# Patient Record
Sex: Male | Born: 1985 | Race: White | Hispanic: No | Marital: Single | Smoking: Current every day smoker
Health system: Southern US, Community
[De-identification: ages and names within clinical notes are randomized; demographics above are authoritative.]

## PROBLEM LIST (undated history)

## (undated) DIAGNOSIS — F1990 Other psychoactive substance use, unspecified, uncomplicated: Secondary | ICD-10-CM

## (undated) DIAGNOSIS — F1910 Other psychoactive substance abuse, uncomplicated: Secondary | ICD-10-CM

## (undated) DIAGNOSIS — B1920 Unspecified viral hepatitis C without hepatic coma: Secondary | ICD-10-CM

---

## 2008-07-26 ENCOUNTER — Emergency Department (HOSPITAL_COMMUNITY): Admission: EM | Admit: 2008-07-26 | Discharge: 2008-07-26 | Payer: Self-pay | Admitting: Emergency Medicine

## 2008-07-30 ENCOUNTER — Emergency Department (HOSPITAL_COMMUNITY): Admission: EM | Admit: 2008-07-30 | Discharge: 2008-07-30 | Payer: Self-pay | Admitting: Emergency Medicine

## 2012-12-09 ENCOUNTER — Encounter (HOSPITAL_COMMUNITY): Payer: Self-pay | Admitting: *Deleted

## 2012-12-09 ENCOUNTER — Emergency Department (HOSPITAL_COMMUNITY)
Admission: EM | Admit: 2012-12-09 | Discharge: 2012-12-10 | Disposition: A | Discharge status: Home / Self Care | Payer: Self-pay | Attending: Emergency Medicine | Admitting: Emergency Medicine

## 2012-12-09 ENCOUNTER — Ambulatory Visit (HOSPITAL_COMMUNITY)
Admission: AD | Admit: 2012-12-09 | Discharge: 2012-12-09 | Disposition: A | Discharge status: Home / Self Care | Payer: Self-pay | Attending: Psychiatry | Admitting: Psychiatry

## 2012-12-09 DIAGNOSIS — F1120 Opioid dependence, uncomplicated: Secondary | ICD-10-CM | POA: Insufficient documentation

## 2012-12-09 DIAGNOSIS — F17200 Nicotine dependence, unspecified, uncomplicated: Secondary | ICD-10-CM | POA: Insufficient documentation

## 2012-12-09 DIAGNOSIS — Z8781 Personal history of (healed) traumatic fracture: Secondary | ICD-10-CM | POA: Insufficient documentation

## 2012-12-09 HISTORY — DX: Other psychoactive substance abuse, uncomplicated: F19.10

## 2012-12-09 LAB — COMPREHENSIVE METABOLIC PANEL
ALT: 35 U/L (ref 0–53)
AST: 24 U/L (ref 0–37)
Albumin: 4 g/dL (ref 3.5–5.2)
CO2: 25 mEq/L (ref 19–32)
Calcium: 9.6 mg/dL (ref 8.4–10.5)
Chloride: 97 mEq/L (ref 96–112)
GFR calc non Af Amer: >90 mL/min (ref >90)
Sodium: 135 mEq/L (ref 135–145)
Total Bilirubin: 0.6 mg/dL (ref 0.3–1.2)

## 2012-12-09 LAB — RAPID URINE DRUG SCREEN, HOSP PERFORMED
Amphetamines: NOT DETECTED
Benzodiazepines: NOT DETECTED
Opiates: POSITIVE — AB
Tetrahydrocannabinol: NOT DETECTED

## 2012-12-09 LAB — CBC
Platelets: 271 10*3/uL (ref 150–400)
RBC: 5.16 MIL/uL (ref 4.22–5.81)
RDW: 12.6 % (ref 11.5–15.5)
WBC: 12 10*3/uL — ABNORMAL HIGH (ref 4.0–10.5)

## 2012-12-09 NOTE — BH Assessment (Signed)
Assessment Note   Pedro Peterson is a 27 y.o. male presents to [facility] for detox/rehab from Heroin.  Pt denies SI/HI/Psych.  Pt reports using Heroin for 2 yrs; consumes 1 gram($120) daily, last use was 12/09/12. Pt injects in his right arm and has a possible abscess at the injection site.  Pt states that he shattered both his ankles in an accident 2 yrs ago and was given pain meds--roxicodone, morphine and dilaudid.  Pt says he was supposed to have surgery but unable to schedule surgery, stating that his opioid need progressed because he couldn't get anymore prescriptions from his doctor.  Pt says he buys the heroin in [facility].  Pt says he was [facility] 2 mos ago but didn't finish the program because his girlfriend was having a baby.  Pt says he lost his girlfriend, daughter, home and his job and needs to get sober.    Pt c/o w/d sxs: diarrhea, body aches, runny nose, "skin crawling", and "nervous energy".  Pt says he used $20 worth of Heroin today to help with w/d sxs.  Pt requested rehab with this Clinical research associate and was given referrals for rehab facilities.  Pt declined [facility] and proper protocol was following by [facility] staff.       Axis I: Opioid Dependence Axis II: Deferred Axis III:  Past Medical History  Diagnosis Date  . Drug abuse    Axis IV: economic problems, housing problems, occupational problems, other psychosocial or environmental problems, problems related to social environment and problems with primary support group Axis V: 51-60 moderate symptoms  Past Medical History: No past medical history on file.  No past surgical history on file.  Family History: No family history on file.  Social History:  reports that he has been smoking.  He does not have any smokeless tobacco history on file. He reports that he uses illicit drugs (Heroin). He reports that he does not drink alcohol.  Additional Social History:  Alcohol / Drug Use Pain Medications: None  Prescriptions: None  Over the Counter: None   History of alcohol / drug use?: Yes Longest period of sobriety (when/how long): 1 month ago  Negative Consequences of Use: Work / School;Personal relationships;Financial Withdrawal Symptoms: Cramps;Diarrhea;Nausea / Vomiting;Other (Comment) ("Nervous", "Skin Crawling") Substance #1 Name of Substance 1: Heroin  1 - Age of First Use: 24 YOM  1 - Amount (size/oz): 1 Gram--$120 1 - Frequency: Daily  1 - Duration: On-going  1 - Last Use / Amount: 12/09/12  CIWA:   COWS: Clinical Opiate Withdrawal Scale (COWS) Resting Pulse Rate: Pulse Rate 80 or below Sweating: No report of chills or flushing Restlessness: Reports difficulty sitting still, but is able to do so Pupil Size: Pupils pinned or normal size for room light Bone or Joint Aches: Mild diffuse discomfort Runny Nose or Tearing: Nasal stuffiness or unusually moist eyes GI Upset: Vomiting or diarrhea Tremor: No tremor Yawning: No yawning Anxiety or Irritability: None Gooseflesh Skin: Skin is smooth COWS Total Score: 6  Allergies: Allergies no known allergies  Home Medications:  (Not in a hospital admission)  OB/GYN Status:  No LMP for male patient.  General Assessment Data Location of Assessment: [facility] Living Arrangements: Alone Can pt return to current living arrangement?: Yes Admission Status: Voluntary Is patient capable of signing voluntary admission?: Yes Transfer from: Home Referral Source: Self/Family/Friend  Education Status Is patient currently in school?: No Current Grade: None  Highest grade of school patient has completed: None  Name  of school: None  Contact person: None   Risk to self Suicidal Ideation: No Suicidal Intent: No Is patient at risk for suicide?: No Suicidal Plan?: No Access to Means: No What has been your use of drugs/alcohol within the last 12 months?: Abusing: Heroin  Previous Attempts/Gestures: No How many times?: 0 Other Self Harm Risks: None  Triggers for Past  Attempts: None known Intentional Self Injurious Behavior: None Family Suicide History: No Recent stressful life event(s): Job Loss;Financial Problems;Other (Comment);Loss (Comment) (Girlfriend left and took daughter; lost home in the past wee) Persecutory voices/beliefs?: No Depression: No Depression Symptoms: Loss of interest in usual pleasures Substance abuse history and/or treatment for substance abuse?: Yes Suicide prevention information given to non-admitted patients: Not applicable  Risk to Others Homicidal Ideation: No Thoughts of Harm to Others: No Current Homicidal Intent: No Current Homicidal Plan: No Access to Homicidal Means: No Identified Victim: None  History of harm to others?: No Assessment of Violence: None Noted Violent Behavior Description: None  Does patient have access to weapons?: No Criminal Charges Pending?: No Does patient have a court date: No  Psychosis Hallucinations: None noted Delusions: None noted  Mental Status Report Appear/Hygiene: Other (Comment) (Appropriate ) Eye Contact: Good Motor Activity: Unremarkable Speech: Logical/coherent Level of Consciousness: Alert Mood: Depressed Affect: Depressed Anxiety Level: Minimal Thought Processes: Coherent;Relevant Judgement: Unimpaired Orientation: Person;Place;Time;Situation Obsessive Compulsive Thoughts/Behaviors: None  Cognitive Functioning Concentration: Normal Memory: Recent Intact;Remote Intact IQ: Average Insight: Fair Impulse Control: Good Appetite: Good Weight Loss: 0 Weight Gain: 0 Sleep: No Change Total Hours of Sleep: 6 Vegetative Symptoms: None  ADLScreening [facility] Assessment Services) Patient's cognitive ability adequate to safely complete daily activities?: Yes Patient able to express need for assistance with ADLs?: Yes Independently performs ADLs?: Yes (appropriate for developmental age)  Abuse/Neglect [facility]) Physical Abuse: Denies Verbal Abuse: Denies Sexual Abuse:  Denies  Prior Inpatient Therapy Prior Inpatient Therapy: Yes Prior Therapy Dates: 2014 Prior Therapy Facilty/Provider(s): [facility]  Reason for Treatment: Detox/Rehab   Prior Outpatient Therapy Prior Outpatient Therapy: No Prior Therapy Dates: None  Prior Therapy Facilty/Provider(s): None  Reason for Treatment: None   ADL Screening (condition at time of admission) Patient's cognitive ability adequate to safely complete daily activities?: Yes Patient able to express need for assistance with ADLs?: Yes Independently performs ADLs?: Yes (appropriate for developmental age) Weakness of Legs: None Weakness of Arms/Hands: None  Home Assistive Devices/Equipment Home Assistive Devices/Equipment: None  Therapy Consults (therapy consults require a physician order) PT Evaluation Needed: No OT Evalulation Needed: No SLP Evaluation Needed: No Abuse/Neglect Assessment (Assessment to be complete while patient is alone) Physical Abuse: Denies Verbal Abuse: Denies Sexual Abuse: Denies Exploitation of patient/patient's resources: Denies Self-Neglect: Denies Values / Beliefs Cultural Requests During Hospitalization: None Spiritual Requests During Hospitalization: None Consults Spiritual Care Consult Needed: No Social Work Consult Needed: No Merchant navy officer (For Healthcare) Advance Directive: Patient does not have advance directive;Patient would not like information Pre-existing out of facility DNR order (yellow form or pink MOST form): No Nutrition Screen- [facility] Adult/WL/AP Patient's home diet: Regular Have you recently lost weight without trying?: No Have you been eating poorly because of a decreased appetite?: No Malnutrition Screening Tool Score: 0  Additional Information 1:1 In Past 12 Months?: No CIRT Risk: No Elopement Risk: No Does patient have medical clearance?: No     Disposition:  Disposition Initial Assessment Completed for this Encounter: Yes Disposition of Patient:  Other dispositions;Outpatient treatment;Referred to ([facility] for Rehab assessment ) Type of outpatient treatment:  Adult Other disposition(s): Referred to outside facility ([facility] ) Patient referred to: [facility]  On Site Evaluation by:   Reviewed with Physician:     Murrell Redden 12/09/2012 8:42 PM

## 2012-12-09 NOTE — ED Notes (Addendum)
Pt accepted at [facility] in [place] [phone] after med clearance. We need to fax information to them and pt states he will drive to facility, requesting detox from Heroin and Xanax. States he is having insomnia, sweating, nause

## 2012-12-10 NOTE — ED Provider Notes (Signed)
History     CSN: [id number]  Arrival date & time 12/09/12  2109   First MD Initiated Contact with Patient 12/09/12 2203      Chief Complaint  Patient presents with  . Medical Clearance    (Consider location/radiation/quality/duration/timing/severity/associated sxs/prior treatment) HPI Comments: Patient here for medical clearance from Heroin which he injects in [facility] Already spoke with [facility] who requests labs be drawn.  At this time denies any pain, nausea.  States got hooked on pain pills after bilateral ankle fractures than switched to IV Heroin, detox once several years ago. Last use today--just enough to take away the abdominal pain and nausea to come and request help   The history is provided by the patient.    Past Medical History  Diagnosis Date  . Drug abuse     History reviewed. No pertinent past surgical history.  No family history on file.  History  Substance Use Topics  . Smoking status: Current Every Day Smoker  . Smokeless tobacco: Not on file  . Alcohol Use: No      Review of Systems  Cardiovascular: Negative for chest pain.  Gastrointestinal: Negative for nausea and abdominal pain.  Psychiatric/Behavioral: Negative for suicidal ideas and behavioral problems.  All other systems reviewed and are negative.    Allergies  Review of patient's allergies indicates no known allergies.  Home Medications   Current Outpatient Rx  Name  Route  Sig  Dispense  Refill  . OVER THE COUNTER MEDICATION   Oral   Take 1 tablet by mouth daily. Mega-men multivit           BP 140/82  Pulse 83  Temp(Src) 98.1 F (36.7 C) (Oral)  Resp 16  Wt 180 lb (81.647 kg)  SpO2 100%  Physical Exam  Nursing note and vitals reviewed. Constitutional: He appears well-developed and well-nourished.  Eyes: Pupils are equal, round, and reactive to light.  Neck: Normal range of motion.  Cardiovascular: Normal rate and regular rhythm.   Pulmonary/Chest: Effort normal and breath  sounds normal.  Abdominal: Soft.  Musculoskeletal: Normal range of motion.  R AC, no sign of infection but indication of frequent IV stick  Skin: Skin is warm.    ED Course  Procedures (including critical care time)  Labs Reviewed  CBC - Abnormal; Notable for the following:    WBC 12.0 (*)    All other components within normal limits  COMPREHENSIVE METABOLIC PANEL - Abnormal; Notable for the following:    Glucose, Bld 115 (*)    All other components within normal limits  SALICYLATE LEVEL - Abnormal; Notable for the following:    Salicylate Lvl <2.0 (*)    All other components within normal limits  URINE RAPID DRUG SCREEN (HOSP PERFORMED) - Abnormal; Notable for the following:    Opiates POSITIVE (*)    All other components within normal limits  ACETAMINOPHEN LEVEL  ETHANOL   No results found.   1. Heroin addiction       MDM   Patient was accepted by [facility] he will be transported by his brother        Arman Filter, NP 12/10/12 [phone]

## 2012-12-10 NOTE — ED Provider Notes (Signed)
Medical screening examination/treatment/procedure(s) were performed by non-physician practitioner and as supervising physician I was immediately available for consultation/collaboration.  John-Adam Lexx Monte, M.D.   John-Adam Charmagne Buhl, MD 12/10/12 0658 

## 2013-01-23 ENCOUNTER — Inpatient Hospital Stay (HOSPITAL_COMMUNITY)
Admission: EM | Admit: 2013-01-23 | Discharge: 2013-01-25 | DRG: 897 | Disposition: A | Discharge status: Home / Self Care | Payer: MEDICAID | Attending: Internal Medicine | Admitting: Internal Medicine

## 2013-01-23 ENCOUNTER — Encounter (HOSPITAL_COMMUNITY): Payer: Self-pay | Admitting: Family Medicine

## 2013-01-23 DIAGNOSIS — F19939 Other psychoactive substance use, unspecified with withdrawal, unspecified: Principal | ICD-10-CM | POA: Diagnosis present

## 2013-01-23 DIAGNOSIS — F1120 Opioid dependence, uncomplicated: Secondary | ICD-10-CM | POA: Diagnosis present

## 2013-01-23 DIAGNOSIS — F1193 Opioid use, unspecified with withdrawal: Secondary | ICD-10-CM

## 2013-01-23 DIAGNOSIS — R1115 Cyclical vomiting syndrome unrelated to migraine: Secondary | ICD-10-CM | POA: Diagnosis present

## 2013-01-23 DIAGNOSIS — F17200 Nicotine dependence, unspecified, uncomplicated: Secondary | ICD-10-CM | POA: Diagnosis present

## 2013-01-23 DIAGNOSIS — E876 Hypokalemia: Secondary | ICD-10-CM

## 2013-01-23 DIAGNOSIS — R1110 Vomiting, unspecified: Secondary | ICD-10-CM

## 2013-01-23 DIAGNOSIS — F1123 Opioid dependence with withdrawal: Secondary | ICD-10-CM | POA: Diagnosis present

## 2013-01-23 DIAGNOSIS — R112 Nausea with vomiting, unspecified: Secondary | ICD-10-CM

## 2013-01-23 MED ORDER — CLONIDINE HCL 0.1 MG PO TABS
0.1000 mg | ORAL_TABLET | Freq: Once | ORAL | Status: AC
  Administered 2013-01-24: 0.1 mg via ORAL
  Filled 2013-01-23: qty 1

## 2013-01-23 MED ORDER — ONDANSETRON HCL 4 MG/2ML IJ SOLN
4.0000 mg | Freq: Once | INTRAMUSCULAR | Status: AC
  Administered 2013-01-24: 4 mg via INTRAVENOUS
  Filled 2013-01-23: qty 2

## 2013-01-23 MED ORDER — METHOCARBAMOL 100 MG/ML IJ SOLN
1000.0000 mg | Freq: Once | INTRAMUSCULAR | Status: DC
  Filled 2013-01-23: qty 10

## 2013-01-23 MED ORDER — SODIUM CHLORIDE 0.9 % IV BOLUS (SEPSIS)
1000.0000 mL | Freq: Once | INTRAVENOUS | Status: AC
  Administered 2013-01-24: 1000 mL via INTRAVENOUS

## 2013-01-23 NOTE — ED Provider Notes (Signed)
History    CSN: [id number] Arrival date & time 01/23/13  2330  First MD Initiated Contact with Patient 01/23/13 2352     Chief Complaint  Patient presents with  . Emesis   (Consider location/radiation/quality/duration/timing/severity/associated sxs/prior Treatment) HPI  Pedro Peterson is a 27 y.o. male detoxing from IV opiates sent from [facility] for multiple episodes of emesis. Last heroin use was yesterday in the p.m. Patient has received Bentyl, Vistaril and Phenergan with little relief. He has had approximately 15 episodes of nonbloody, nonbilious, no coffee ground emesis or in today. Patient has detox from IV heroine once before in the past, he was given Suboxone and did not have similar severe emesis. He states that he is getting subutex at [facility] and is having severe myalgia in addition to nausea and vomiting. Patient is passing gas he is also having diarrhea. Patient denies fever.   Past Medical History  Diagnosis Date  . Drug abuse    History reviewed. No pertinent past surgical history. No family history on file. History  Substance Use Topics  . Smoking status: Current Every Day Smoker -- 1.00 packs/day    Types: Cigarettes  . Smokeless tobacco: Not on file  . Alcohol Use: Yes     Comment: 3-4 bottles/day    Review of Systems  Constitutional:       Negative except as described in HPI  HENT:       Negative except as described in HPI  Respiratory:       Negative except as described in HPI  Cardiovascular:       Negative except as described in HPI  Gastrointestinal:       Negative except as described in HPI  Genitourinary:       Negative except as described in HPI  Musculoskeletal:       Negative except as described in HPI  Skin:       Negative except as described in HPI  Neurological:       Negative except as described in HPI  All other systems reviewed and are negative.    Allergies  Review of patient's allergies indicates no known allergies.  Home  Medications   Current Outpatient Rx  Name  Route  Sig  Dispense  Refill  . OVER THE COUNTER MEDICATION   Oral   Take 1 tablet by mouth daily. Mega-men multivit          BP 144/91  Pulse 55  Temp(Src) 98.4 F (36.9 C) (Oral)  Resp 18  Ht 5\' 11"  (1.803 m)  Wt 185 lb (83.915 kg)  BMI 25.81 kg/m2  SpO2 100% Physical Exam  Nursing note and vitals reviewed. Constitutional: He is oriented to person, place, and time. He appears well-developed and well-nourished. No distress.  HENT:  Head: Normocephalic.  Mouth/Throat: Oropharynx is clear and moist.  Eyes: Conjunctivae and EOM are normal. Pupils are equal, round, and reactive to light.  Cardiovascular: Normal rate.   Pulmonary/Chest: Effort normal and breath sounds normal. No stridor. No respiratory distress. He has no wheezes. He has no rales. He exhibits no tenderness.  Abdominal: Soft. Bowel sounds are normal. He exhibits no distension and no mass. There is no tenderness. There is no rebound and no guarding.  Musculoskeletal: Normal range of motion.  Neurological: He is alert and oriented to person, place, and time.  Skin:  Injection marks to right a.c., no cellulitis or signs of infection  Psychiatric:  agitiated    ED Course  Procedures (including critical care time) Labs Reviewed  BASIC METABOLIC PANEL - Abnormal; Notable for the following:    Potassium 3.4 (*)    Glucose, Bld 126 (*)    All other components within normal limits  CBC WITH DIFFERENTIAL - Abnormal; Notable for the following:    Neutrophils Relative % 82 (*)    All other components within normal limits   No results found.  2:30 AM patient  is resting comfortably, he has no complaints at this time. Abdominal exam remains the same, no tenderness guarding or rebound.  5:42 AM patient is vomiting again. Require admission for intractable emesis  1. Emesis   2. Opiate withdrawal     MDM   Filed Vitals:   01/23/13 2346 01/24/13 0112 01/24/13 0149  01/24/13 0437  BP: 144/91 146/81 137/70 148/89  Pulse: 55  53 87  Temp: 98.4 F (36.9 C)  98.6 F (37 C)   TempSrc: Oral  Oral   Resp: 18  18 16   Height: 5\' 11"  (1.803 m)     Weight: 185 lb (83.915 kg)     SpO2: 100%  98% 100%     [name] is a 27 y.o. male with multiple episodes of emesis, patient is withdrawing from IV narcotics, last use was yesterday. Patient has no electrolyte abnormalities. Serial abdominal exams remained benign He is tolerating by mouth and is indurated without issue. And concern about discharging him back to [facility], concern for bounces back. I will discuss this with Karleen Hampshire, psychiatry PA  Despite IV Zofran and Phenergan patient continues to have multiple episodes of emesis. He will need admission for intractable vomiting.  Will be admitted to Dr. Elisabeth Pigeon, [facility] hospitalist to a MedSurg bed.  Medications  methocarbamol (ROBAXIN) 500 mg in dextrose 5 % 50 mL IVPB (500 mg Intravenous New Bag/Given 01/24/13 0109)  sodium chloride 0.9 % bolus 1,000 mL (1,000 mLs Intravenous New Bag/Given 01/24/13 0106)  ondansetron (ZOFRAN) injection 4 mg (4 mg Intravenous Given 01/24/13 0107)  cloNIDine (CATAPRES) tablet 0.1 mg (0.1 mg Oral Given 01/24/13 0112)     Wynetta Emery, PA-C 01/24/13 4696

## 2013-01-23 NOTE — ED Notes (Addendum)
Patient presents from [facility] for evaluation of vomting. Patient states he is at [facility] for detox from heroin. Last used yesterday. Patient received Bentyl 20mg  PO at 1900, Vistaril 50mg  at 1500, Vistaril 100mg  at 2130 and Phenergan 25mg  Po at 2130.

## 2013-01-24 ENCOUNTER — Encounter (HOSPITAL_COMMUNITY): Payer: Self-pay

## 2013-01-24 DIAGNOSIS — F1123 Opioid dependence with withdrawal: Secondary | ICD-10-CM | POA: Diagnosis present

## 2013-01-24 DIAGNOSIS — E876 Hypokalemia: Secondary | ICD-10-CM | POA: Diagnosis present

## 2013-01-24 DIAGNOSIS — R1110 Vomiting, unspecified: Secondary | ICD-10-CM | POA: Diagnosis present

## 2013-01-24 DIAGNOSIS — F1120 Opioid dependence, uncomplicated: Secondary | ICD-10-CM

## 2013-01-24 LAB — COMPREHENSIVE METABOLIC PANEL
ALT: 25 U/L (ref 0–53)
AST: 22 U/L (ref 0–37)
Albumin: 3.8 g/dL (ref 3.5–5.2)
CO2: 26 mEq/L (ref 19–32)
Calcium: 9.3 mg/dL (ref 8.4–10.5)
Creatinine, Ser: 0.77 mg/dL (ref 0.50–1.35)
GFR calc non Af Amer: >90 mL/min (ref >90)
Sodium: 140 mEq/L (ref 135–145)
Total Protein: 7.9 g/dL (ref 6.0–8.3)

## 2013-01-24 LAB — BASIC METABOLIC PANEL
BUN: 6 mg/dL (ref 6–23)
Calcium: 9.6 mg/dL (ref 8.4–10.5)
GFR calc Af Amer: >90 mL/min (ref >90)
GFR calc non Af Amer: >90 mL/min (ref >90)
Potassium: 3.4 mEq/L — ABNORMAL LOW (ref 3.5–5.1)

## 2013-01-24 LAB — CBC WITH DIFFERENTIAL/PLATELET
Basophils Absolute: 0 10*3/uL (ref 0.0–0.1)
Eosinophils Relative: 0 % (ref 0–5)
Eosinophils Relative: 0 % (ref 0–5)
HCT: 43.6 % (ref 39.0–52.0)
Hemoglobin: 14.9 g/dL (ref 13.0–17.0)
Hemoglobin: 15.2 g/dL (ref 13.0–17.0)
Lymphocytes Relative: 14 % (ref 12–46)
Lymphocytes Relative: 9 % — ABNORMAL LOW (ref 12–46)
Lymphs Abs: 1 10*3/uL (ref 0.7–4.0)
Lymphs Abs: 0.9 10*3/uL (ref 0.7–4.0)
MCV: 85.7 fL (ref 78.0–100.0)
MCV: 85.8 fL (ref 78.0–100.0)
Monocytes Absolute: 0.3 10*3/uL (ref 0.1–1.0)
Monocytes Absolute: 0.4 10*3/uL (ref 0.1–1.0)
Monocytes Relative: 4 % (ref 3–12)
Neutro Abs: 8.7 10*3/uL — ABNORMAL HIGH (ref 1.7–7.7)
RBC: 5.08 MIL/uL (ref 4.22–5.81)
RDW: 12.6 % (ref 11.5–15.5)
WBC: 9.9 10*3/uL (ref 4.0–10.5)

## 2013-01-24 LAB — PHOSPHORUS: Phosphorus: 2.5 mg/dL (ref 2.3–4.6)

## 2013-01-24 LAB — RAPID URINE DRUG SCREEN, HOSP PERFORMED
Barbiturates: NOT DETECTED
Benzodiazepines: POSITIVE — AB
Cocaine: NOT DETECTED

## 2013-01-24 LAB — APTT: aPTT: 29 seconds (ref 24–37)

## 2013-01-24 MED ORDER — ACETAMINOPHEN 650 MG RE SUPP: 650.0000 mg | Freq: Four times a day (QID) | RECTAL | Status: DC | PRN

## 2013-01-24 MED ORDER — SODIUM CHLORIDE 0.9 % IJ SOLN: 3.0000 mL | Freq: Two times a day (BID) | INTRAMUSCULAR | Status: DC

## 2013-01-24 MED ORDER — SODIUM CHLORIDE 0.9 % IV SOLN
INTRAVENOUS | Status: DC
  Administered 2013-01-24: 22:00:00 via INTRAVENOUS

## 2013-01-24 MED ORDER — ONDANSETRON HCL 4 MG/2ML IJ SOLN: 4.0000 mg | Freq: Four times a day (QID) | INTRAMUSCULAR | Status: DC | PRN

## 2013-01-24 MED ORDER — LORAZEPAM 2 MG/ML IJ SOLN
1.0000 mg | Freq: Once | INTRAMUSCULAR | Status: AC
  Administered 2013-01-24: 1 mg via INTRAVENOUS
  Filled 2013-01-24: qty 1

## 2013-01-24 MED ORDER — PANTOPRAZOLE SODIUM 40 MG IV SOLR
40.0000 mg | Freq: Two times a day (BID) | INTRAVENOUS | Status: DC
  Administered 2013-01-24 (×2): 40 mg via INTRAVENOUS
  Filled 2013-01-24 (×4): qty 40

## 2013-01-24 MED ORDER — METHOCARBAMOL 100 MG/ML IJ SOLN
500.0000 mg | Freq: Once | INTRAVENOUS | Status: AC
  Administered 2013-01-24: 500 mg via INTRAVENOUS
  Filled 2013-01-24: qty 5

## 2013-01-24 MED ORDER — BUPRENORPHINE HCL 2 MG SL SUBL
2.0000 mg | SUBLINGUAL_TABLET | Freq: Once | SUBLINGUAL | Status: AC
  Administered 2013-01-24: 2 mg via SUBLINGUAL
  Filled 2013-01-24: qty 1

## 2013-01-24 MED ORDER — POTASSIUM CHLORIDE 10 MEQ/100ML IV SOLN
10.0000 meq | INTRAVENOUS | Status: AC
  Administered 2013-01-24 (×2): 10 meq via INTRAVENOUS
  Filled 2013-01-24 (×3): qty 100

## 2013-01-24 MED ORDER — SODIUM CHLORIDE 0.9 % IV BOLUS (SEPSIS)
1000.0000 mL | Freq: Once | INTRAVENOUS | Status: AC
  Administered 2013-01-24: 1000 mL via INTRAVENOUS

## 2013-01-24 MED ORDER — ONDANSETRON HCL 4 MG/2ML IJ SOLN: 4.0000 mg | Freq: Three times a day (TID) | INTRAMUSCULAR | Status: DC | PRN

## 2013-01-24 MED ORDER — ZOLPIDEM TARTRATE 5 MG PO TABS
5.0000 mg | ORAL_TABLET | Freq: Once | ORAL | Status: AC
  Administered 2013-01-24: 5 mg via ORAL
  Filled 2013-01-24: qty 1

## 2013-01-24 MED ORDER — ONDANSETRON HCL 4 MG/2ML IJ SOLN
4.0000 mg | Freq: Once | INTRAMUSCULAR | Status: AC
  Administered 2013-01-24: 4 mg via INTRAVENOUS
  Filled 2013-01-24: qty 2

## 2013-01-24 MED ORDER — HYDROCODONE-ACETAMINOPHEN 5-325 MG PO TABS
1.0000 | ORAL_TABLET | ORAL | Status: DC | PRN
  Administered 2013-01-24: 2 via ORAL
  Filled 2013-01-24: qty 2

## 2013-01-24 MED ORDER — POTASSIUM CHLORIDE 10 MEQ/100ML IV SOLN
10.0000 meq | Freq: Once | INTRAVENOUS | Status: AC
  Administered 2013-01-24: 10 meq via INTRAVENOUS
  Filled 2013-01-24: qty 100

## 2013-01-24 MED ORDER — PROMETHAZINE HCL 25 MG/ML IJ SOLN: 25.0000 mg | Freq: Four times a day (QID) | INTRAMUSCULAR | Status: DC | PRN

## 2013-01-24 MED ORDER — PROMETHAZINE HCL 25 MG/ML IJ SOLN
12.5000 mg | Freq: Once | INTRAMUSCULAR | Status: AC
  Administered 2013-01-24: 12.5 mg via INTRAVENOUS
  Filled 2013-01-24: qty 1

## 2013-01-24 MED ORDER — ACETAMINOPHEN 325 MG PO TABS: 650.0000 mg | ORAL_TABLET | Freq: Four times a day (QID) | ORAL | Status: DC | PRN

## 2013-01-24 MED ORDER — METHOCARBAMOL 100 MG/ML IJ SOLN: 500.0000 mg | Freq: Once | INTRAMUSCULAR | Status: DC

## 2013-01-24 NOTE — Progress Notes (Signed)
[facility] PROGRESS NOTE  JAHQUEZ STEFFLER ZOX:[id number] DOB: [birth year] DOA: 01/23/2013  PCP: none  Brief HPI: 27 y.o. male with past medical history of drug abuse who presented to [facility] ED with intractable nausea and vomiting. He was sent from [facility] where he was being managed for detox from IV heroin. Patient reported last time he used IV heroin was 1 day prior to this admission. Patient has no significant abdominal pain but did have about 15 episodes of non bloody vomiting. No complaints of chest pain, shortness of breath or palpitations. No lightheadedness or loss of consciousness.   Past medical history:  Past Medical History  Diagnosis Date  . Drug abuse     Consultants: None  Procedures: None  Antibiotics: None  Subjective: Patient complains of nausea and vomiting. He says he is vomiting even ice chips and medications. Denies any meat consumption recently. Last meal was pizza on Tuesday night.  Objective: Vital Signs  Filed Vitals:   01/24/13 0112 01/24/13 0149 01/24/13 0437 01/24/13 0651  BP: 146/81 137/70 148/89 158/75  Pulse:  53 87 62  Temp:  98.6 F (37 C)    TempSrc:  Oral    Resp:  18 16 18   Height:      Weight:      SpO2:  98% 100% 100%   No intake or output data in the 24 hours ending 01/24/13 0922 Filed Weights   01/23/13 2346  Weight: 83.915 kg (185 lb)   General appearance: alert, cooperative, appears stated age and no distress Eyes: conjunctivae/corneas clear. PERRL, EOM's intact.  Throat: lips, mucosa, and tongue normal; teeth and gums normal Resp: clear to auscultation bilaterally Cardio: regular rate and rhythm, S1, S2 normal, no murmur, click, rub or gallop GI: soft, non-tender; bowel sounds normal; no masses,  no organomegaly Extremities: extremities normal, atraumatic, no cyanosis or edema Pulses: 2+ and symmetric Skin: Skin color, texture, turgor normal. No rashes or lesions Neurologic: Alert and oriented x 3. No focal deficits.  Lab  Results:  Basic Metabolic Panel:  Recent Labs Lab 01/24/13 0045  NA 139  K 3.4*  CL 102  CO2 26  GLUCOSE 126*  BUN 6  CREATININE 0.79  CALCIUM 9.6   CBC:  Recent Labs Lab 01/24/13 0045 01/24/13 0902  WBC 6.9 9.9  NEUTROABS 5.6 8.7*  HGB 14.9 15.2  HCT 42.7 43.6  MCV 85.7 85.8  PLT 242 245    Studies/Results: No results found.  Medications:  Scheduled: . LORazepam  1 mg Intravenous Once  . pantoprazole (PROTONIX) IV  40 mg Intravenous Q12H  . potassium chloride  10 mEq Intravenous Q1 Hr x 3  . sodium chloride  3 mL Intravenous Q12H   Continuous: . sodium chloride     WJX:BJYNWGNFAOZHY, acetaminophen, HYDROcodone-acetaminophen, ondansetron (ZOFRAN) IV, promethazine  Assessment/Plan:  Principal Problem:   Opiate withdrawal Active Problems:   Emesis   Hypokalemia    Nausea and vomiting  This is most likely secondary to Opiate withdrawal. Abdomen is benign and do not suspect de novo GI pathology. No need for imaging studies. Continue supportive treatment. Try ativan. Give PPI. Continue PRN anti-emetics. Follow labs. Patient educated.  Opiate Dependence and Withdrawal Continue symptomatic treatment. CSW to see for abuse issues. Was at [facility] for detox.  Hypokalemia  Repleted in ED. Follow up repeat BMP.  Code Status:  Full Code DVT Prophylaxis:   SCD's Family Communication: Discussed with patient  Disposition Plan: Await improvement. Will likely return home when better.  LOS: 1 day   [facility]  [facility] Pager [phone] 01/24/2013, 9:22 AM  If 8PM-8AM, please contact night-coverage at [email or web address], password [facility]

## 2013-01-24 NOTE — Progress Notes (Signed)
Patient not interested in activating MyChart 

## 2013-01-24 NOTE — H&P (Signed)
[facility] History and Physical  Pedro Peterson ZOX:[id number] DOB: 03-Sep-1985 DOA: 01/23/2013  Referring physician: ER physician PCP: No primary provider on file.   Chief Complaint: opiate withdrawal   HPI:  27 y.o. male with past medical history of drug abuse who presented to [facility] ED with intractable nausea and vomiting as he was being sent from [facility] for detoxing from IV heroin. Patient reported last time he used IV heroin was 1 day prior to this admission. Patient has no significant abdominal pain but did have about 15 episodes of non bloody vomiting. No complaints of chest pain, shortness of breath or palpitations. No lightheadedness or loss of consciousness. In ED, patient remains hemodynamically stable with BP 137/70, HR 53 - 87, O2 saturation of 98%. His BMP was remarkable for potassium of 3.4. His alert, oriented to time, place and person.   Assessment and Plan:  Principal Problem:   Opiate withdrawal - pt used IV heroin - we will give 1 dose of subutex as a alternative to suboxone (I spoke with pharmacy if they give suboxone 1 dose for withdrawals but they don't unless the patient is already on this medication which he is not) - check UDS - continue to monitor withdrawals on telemetry - IV fluids and antiemetics - advance diet as tolerated - social work consult in place for drug abuse  Active Problems:   Emesis - likely due to opiate withdrawals - management as above - continue supportive care with IV fluids - antiemetics PRN; changed Zofran to phenergan as zofran did not provide much relief   Hypokalemia - repleted in ED - follow up repeat BMP  Manson Passey [facility] [phone]  Review of Systems:  Constitutional: Negative for fever, chills and malaise/fatigue. Negative for diaphoresis.  HENT: Negative for hearing loss, ear pain, nosebleeds, congestion, sore throat, neck pain, tinnitus and ear discharge.   Eyes: Negative for blurred vision, double vision, photophobia,  pain, discharge and redness.  Respiratory: Negative for cough, hemoptysis, sputum production, shortness of breath, wheezing and stridor.   Cardiovascular: Negative for chest pain, palpitations, orthopnea, claudication and leg swelling.  Gastrointestinal: positive  for nausea, vomiting and abdominal pain. Negative for heartburn, constipation, blood in stool and melena.  Genitourinary: Negative for dysuria, urgency, frequency, hematuria and flank pain.  Musculoskeletal: Negative for myalgias, back pain, joint pain and falls.  Skin: Negative for itching and rash.  Neurological: Negative for dizziness and weakness. Negative for tingling, tremors, sensory change, speech change, focal weakness, loss of consciousness and headaches.  Endo/Heme/Allergies: Negative for environmental allergies and polydipsia. Does not bruise/bleed easily.  Psychiatric/Behavioral: Negative for suicidal ideas. The patient is not nervous/anxious.      Past Medical History  Diagnosis Date  . Drug abuse    History reviewed. No pertinent past surgical history. Social History:  reports that he has been smoking Cigarettes.  He has been smoking about 1.00 pack per day. He does not have any smokeless tobacco history on file. He reports that  drinks alcohol. He reports that he uses illicit drugs (Heroin).  No Known Allergies  Family History: Family medical history significant for HTN, HLD   Prior to Admission medications   Not on File   Physical Exam: Filed Vitals:   01/23/13 2346 01/24/13 0112 01/24/13 0149 01/24/13 0437  BP: 144/91 146/81 137/70 148/89  Pulse: 55  53 87  Temp: 98.4 F (36.9 C)  98.6 F (37 C)   TempSrc: Oral  Oral   Resp: 18  18  16  Height: 5\' 11"  (1.803 m)     Weight: 83.915 kg (185 lb)     SpO2: 100%  98% 100%    Physical Exam  Constitutional: Appears well-developed and well-nourished. No distress.  HENT: Normocephalic. External right and left ear normal. Oropharynx is clear and moist.   Eyes: Conjunctivae and EOM are normal. PERRLA, no scleral icterus.  Neck: Normal ROM. Neck supple. No JVD. No tracheal deviation. No thyromegaly.  CVS: RRR, S1/S2 +, no murmurs, no gallops, no carotid bruit.  Pulmonary: Effort and breath sounds normal, no stridor, rhonchi, wheezes, rales.  Abdominal: Soft. BS +,  no distension, tenderness, rebound or guarding.  Musculoskeletal: Normal range of motion. No edema and no tenderness.  Lymphadenopathy: No lymphadenopathy noted, cervical, inguinal. Neuro: Alert. Normal reflexes, muscle tone coordination. No cranial nerve deficit. Skin: Skin is warm and dry. No rash noted. Not diaphoretic. No erythema. No pallor.  Psychiatric: Normal mood and affect. Behavior, judgment, thought content normal.   Labs on Admission:  Basic Metabolic Panel:  Recent Labs Lab 01/24/13 0045  NA 139  K 3.4*  CL 102  CO2 26  GLUCOSE 126*  BUN 6  CREATININE 0.79  CALCIUM 9.6   Liver Function Tests: No results found for this basename: AST, ALT, ALKPHOS, BILITOT, PROT, ALBUMIN,  in the last 168 hours No results found for this basename: LIPASE, AMYLASE,  in the last 168 hours No results found for this basename: AMMONIA,  in the last 168 hours CBC:  Recent Labs Lab 01/24/13 0045  WBC 6.9  NEUTROABS 5.6  HGB 14.9  HCT 42.7  MCV 85.7  PLT 242   Cardiac Enzymes: No results found for this basename: CKTOTAL, CKMB, CKMBINDEX, TROPONINI,  in the last 168 hours BNP: No components found with this basename: POCBNP,  CBG: No results found for this basename: GLUCAP,  in the last 168 hours  Radiological Exams on Admission: No results found.  EKG: Normal sinus rhythm, no ST/T wave changes  Code Status: Full Family Communication: Pt at bedside Disposition Plan: Admit for further evaluation  Manson Passey, MD  [facility] Pager [phone]  If 7PM-7AM, please contact night-coverage [email or web address] Password [facility] 01/24/2013, 6:24 AM

## 2013-01-24 NOTE — ED Notes (Signed)
Fluid challenge not done d/t vomiting

## 2013-01-24 NOTE — ED Provider Notes (Signed)
Medical screening examination/treatment/procedure(s) were performed by non-physician practitioner and as supervising physician I was immediately available for consultation/collaboration.  Lesleigh Hughson, MD 01/24/13 0655 

## 2013-01-24 NOTE — ED Notes (Signed)
Report called nurse busy will call back

## 2013-01-24 NOTE — Progress Notes (Signed)
Report received from [facility]. No change in assessment, continue current plan of care. Maeola Harman

## 2013-01-24 NOTE — ED Notes (Signed)
Pt vomited large amount of liquid on floor.

## 2013-01-24 NOTE — ED Notes (Signed)
Pt vomited large amount off the side of the bed onto the floor.

## 2013-01-25 LAB — COMPREHENSIVE METABOLIC PANEL
ALT: 20 U/L (ref 0–53)
AST: 14 U/L (ref 0–37)
Alkaline Phosphatase: 97 U/L (ref 39–117)
CO2: 23 mEq/L (ref 19–32)
Calcium: 9 mg/dL (ref 8.4–10.5)
GFR calc Af Amer: >90 mL/min (ref >90)
GFR calc non Af Amer: >90 mL/min (ref >90)
Glucose, Bld: 114 mg/dL — ABNORMAL HIGH (ref 70–99)
Potassium: 3.4 mEq/L — ABNORMAL LOW (ref 3.5–5.1)
Sodium: 138 mEq/L (ref 135–145)

## 2013-01-25 LAB — CBC
HCT: 42.7 % (ref 39.0–52.0)
Hemoglobin: 14.7 g/dL (ref 13.0–17.0)
MCH: 29.4 pg (ref 26.0–34.0)
MCHC: 34.4 g/dL (ref 30.0–36.0)
RDW: 12.7 % (ref 11.5–15.5)

## 2013-01-25 MED ORDER — LORAZEPAM 0.5 MG PO TABS: 0.5000 mg | ORAL_TABLET | Freq: Three times a day (TID) | ORAL | Status: DC | PRN

## 2013-01-25 MED ORDER — RANITIDINE HCL 150 MG PO TABS: 150.0000 mg | ORAL_TABLET | Freq: Two times a day (BID) | ORAL | Status: AC

## 2013-01-25 MED ORDER — POTASSIUM CHLORIDE CRYS ER 20 MEQ PO TBCR
40.0000 meq | EXTENDED_RELEASE_TABLET | Freq: Once | ORAL | Status: DC
  Filled 2013-01-25: qty 2

## 2013-01-25 MED ORDER — FAMOTIDINE 20 MG PO TABS
20.0000 mg | ORAL_TABLET | Freq: Every day | ORAL | Status: DC
  Filled 2013-01-25: qty 1

## 2013-01-25 NOTE — Progress Notes (Signed)
Clinical Social Work Department BRIEF PSYCHOSOCIAL ASSESSMENT 01/25/2013  Patient:  Pedro Peterson, Pedro Peterson     Account Number:  [id number]     Admit date:  01/23/2013  Clinical Social Worker:  Dennison Bulla  Date/Time:  01/25/2013 09:00 AM  Referred by:  Physician  Date Referred:  01/25/2013 Referred for  Substance Abuse   Other Referral:   Interview type:  Patient Other interview type:    PSYCHOSOCIAL DATA Living Status:  FAMILY Admitted from facility:   Level of care:   Primary support name:  Gregorio Primary support relationship to patient:  SIBLING Degree of support available:   Adequate    CURRENT CONCERNS Current Concerns  Post-Acute Placement   Other Concerns:    SOCIAL WORK ASSESSMENT / PLAN CSW received consult to assist patient with return to [facility]. CSW reviewed chart and met with patient at bedside. Patient agreeable to assessment and no visitors present during session.    CSW introduced myself and explained role. Patient reports that he is battling addiction and went to [facility] for detox. Patient was at [facility] for 1 day before being sent to the hospital. Patient reports he wants to remain sober and wants to return to [facility].    CSW called [facility] who reports that patient has completed detox so if he wanted to return then he would have to be accepted for treatment. CSW inquired if patient could return today since he is being released from the hospital. [facility] took CSW contact information and agreeable to call once they determine if they can accept patient.    CSW updated MD on progress and agreeable to keep MD updated on DC plans.   Assessment/plan status:  Psychosocial Support/Ongoing Assessment of Needs Other assessment/ plan:   Information/referral to community resources:   Possible return to [facility]    PATIENT'S/FAMILY'S RESPONSE TO PLAN OF CARE: Patient alert and oriented. Patient anxious to DC from the hospital and agreeable to return to [facility]       (Coverage for Becky Sax)

## 2013-01-25 NOTE — Progress Notes (Signed)
Clinical Social Work  [facility] reports that patient has been to their facility four times in the past. [facility] reports that patient came for treatment about 2 months ago and left during the middle of treatment. [facility] reports they are not willing to accept patient back at this time but reports that patient can continue to follow up with them to determine if administers will change their mind about accepting him for treatment.   CSW met with patient at bedside and explained [facility] is not willing to accept him at this time. Patient interested in [facility] and reports he has been there in the past. CSW called [facility] but no one is answering phones at this time. CSW assumes that facility is not accepting patients on holiday. CSW provided patient with other resources such as [facility] and [facility] but patient reports he is not interested in these places.   Patient reports no current outpatient follow up. CSW explained the importance of support on outpatient basis until he finds an inpatient treatment facility. Patient reports that he plans to stay with friends at DC and will remain sober. Patient refused any outpatient resources.  CSW notified RN and MD of patient's DC plans. CSW is signing off but available if further needs arise.  Unk Lightning, LCSW (Coverage for [employer])

## 2013-01-25 NOTE — Progress Notes (Signed)
Patient discharge ambulatory, no complaints of any pain or discomfort. Medications not given as patient wants to leave. Discharge instructions done and was given. to the patient.

## 2013-01-25 NOTE — Discharge Summary (Signed)
[facility]  Physician Discharge Summary   Patient ID: Pedro Peterson MRN: [id number] DOB/AGE: 27-Dec-1985 27 y.o.  Admit date: 01/23/2013 Discharge date: 01/25/2013  PCP: None  DISCHARGE DIAGNOSES:  Principal Problem:   Opiate withdrawal Active Problems:   Emesis   Hypokalemia   RECOMMENDATIONS FOR OUTPATIENT FOLLOW UP: 1. Patient to follow up with OP detox center.   DISCHARGE CONDITION: fair  Diet recommendation: Regular  Filed Weights   01/23/13 2346 01/24/13 0820 01/25/13 0635  Weight: 83.915 kg (185 lb) 77.701 kg (171 lb 4.8 oz) 77.4 kg (170 lb 10.2 oz)    INITIAL HISTORY: 27 y.o. male with past medical history of drug abuse who presented to [facility] ED with intractable nausea and vomiting. He was sent from [facility] where he was being managed for detox from IV heroin. Patient reported last time he used IV heroin was 1 day prior to this admission. Patient has no significant abdominal pain but did have about 15 episodes of non bloody vomiting. No complaints of chest pain, shortness of breath or palpitations. No lightheadedness or loss of consciousness.   Consultations:  None  Procedures:  None  HOSPITAL COURSE:   Nausea and vomiting  This was most likely secondary to Opiate withdrawal. Abdomen was benign and did not suspect de novo GI pathology. There was no need for imaging studies. Patient was given supportive treatment. His symptoms have resolved as of yesterday. He is tolerating liquids. Continue antaacids at home for 2 weeks.   Opiate Dependence and Withdrawal  Patient feels better today. He plans to resume detox at OP facility. CSW assisting with same. He is stable for discharge. If he goes home instead of detox will prescribe a few tablets of Ativan.   Hypokalemia  Will be repleted.   Overall patient is stable for discharge. He is tolerating orally. Per CSW [facility] will not take him back. He will go home.   PERTINENT LABS:  The results of significant  diagnostics from this hospitalization (including imaging, microbiology, ancillary and laboratory) are listed below for reference.     Labs: Basic Metabolic Panel:  Recent Labs Lab 01/24/13 0045 01/24/13 0902 01/25/13 0511  NA 139 140 138  K 3.4* 3.5 3.4*  CL 102 103 105  CO2 26 26 23   GLUCOSE 126* 135* 114*  BUN 6 5* 4*  CREATININE 0.79 0.77 0.70  CALCIUM 9.6 9.3 9.0  MG  --  1.8  --   PHOS  --  2.5  --    Liver Function Tests:  Recent Labs Lab 01/24/13 0902 01/25/13 0511  AST 22 14  ALT 25 20  ALKPHOS 120* 97  BILITOT 0.6 0.9  PROT 7.9 6.9  ALBUMIN 3.8 3.3*   CBC:  Recent Labs Lab 01/24/13 0045 01/24/13 0902 01/25/13 0511  WBC 6.9 9.9 7.9  NEUTROABS 5.6 8.7*  --   HGB 14.9 15.2 14.7  HCT 42.7 43.6 42.7  MCV 85.7 85.8 85.4  PLT 242 245 228    IMAGING STUDIES No results found.  DISCHARGE EXAMINATION: Filed Vitals:   01/24/13 0820 01/24/13 1358 01/24/13 2244 01/25/13 0635  BP: 147/69 132/61 124/66 126/65  Pulse: 57 67 62 57  Temp: 98.4 F (36.9 C) 98.4 F (36.9 C) 99.5 F (37.5 C) 98.6 F (37 C)  TempSrc: Oral Oral Oral Oral  Resp: 20 20  20   Height: 5\' 11"  (1.803 m)     Weight: 77.701 kg (171 lb 4.8 oz)   77.4 kg (170 lb 10.2  oz)  SpO2: 100% 100% 97% 100%   General appearance: alert, cooperative, appears stated age and no distress Resp: clear to auscultation bilaterally Cardio: regular rate and rhythm, S1, S2 normal, no murmur, click, rub or gallop GI: soft, non-tender; bowel sounds normal; no masses,  no organomegaly Neurologic: Alert and oriented X 3, normal strength and tone. Normal symmetric reflexes. Normal coordination and gait  DISPOSITION: Home Discharge Orders   Future Orders Complete By Expires     Diet general  As directed     Discharge instructions  As directed     Comments:      Please refrain from illicit drug use.    Increase activity slowly  As directed        ALLERGIES: No Known Allergies  Current Discharge  Medication List    START taking these medications   Details  LORazepam (ATIVAN) 0.5 MG tablet Take 1 tablet (0.5 mg total) by mouth every 8 (eight) hours as needed for anxiety. Qty: 15 tablet, Refills: 0    ranitidine (ZANTAC) 150 MG tablet Take 1 tablet (150 mg total) by mouth 2 (two) times daily. Qty: 14 tablet, Refills: 0         TOTAL DISCHARGE TIME: 35 mins  [facility]  [facility] Pager [phone]  01/25/2013, 11:13 AM

## 2013-01-25 NOTE — Discharge Instructions (Signed)
Follow up for detox as planned

## 2018-01-30 ENCOUNTER — Emergency Department (HOSPITAL_BASED_OUTPATIENT_CLINIC_OR_DEPARTMENT_OTHER): Payer: Self-pay

## 2018-01-30 ENCOUNTER — Other Ambulatory Visit: Payer: Self-pay

## 2018-01-30 ENCOUNTER — Encounter (HOSPITAL_BASED_OUTPATIENT_CLINIC_OR_DEPARTMENT_OTHER): Payer: Self-pay | Admitting: *Deleted

## 2018-01-30 ENCOUNTER — Emergency Department (HOSPITAL_BASED_OUTPATIENT_CLINIC_OR_DEPARTMENT_OTHER)
Admission: EM | Admit: 2018-01-30 | Discharge: 2018-01-30 | Discharge status: Against Medical Advice | Payer: Self-pay | Attending: Internal Medicine | Admitting: Internal Medicine

## 2018-01-30 DIAGNOSIS — L03113 Cellulitis of right upper limb: Secondary | ICD-10-CM | POA: Diagnosis present

## 2018-01-30 DIAGNOSIS — L0390 Cellulitis, unspecified: Secondary | ICD-10-CM

## 2018-01-30 DIAGNOSIS — Z79899 Other long term (current) drug therapy: Secondary | ICD-10-CM | POA: Insufficient documentation

## 2018-01-30 DIAGNOSIS — R609 Edema, unspecified: Secondary | ICD-10-CM

## 2018-01-30 DIAGNOSIS — F17210 Nicotine dependence, cigarettes, uncomplicated: Secondary | ICD-10-CM | POA: Insufficient documentation

## 2018-01-30 DIAGNOSIS — R079 Chest pain, unspecified: Secondary | ICD-10-CM | POA: Insufficient documentation

## 2018-01-30 HISTORY — DX: Other psychoactive substance use, unspecified, uncomplicated: F19.90

## 2018-01-30 LAB — CBC WITH DIFFERENTIAL/PLATELET
Basophils Absolute: 0 10*3/uL (ref 0.0–0.1)
Basophils Relative: 0 %
EOS ABS: 0 10*3/uL (ref 0.0–0.7)
Eosinophils Relative: 0 %
HCT: 36.6 % — ABNORMAL LOW (ref 39.0–52.0)
HEMOGLOBIN: 13.2 g/dL (ref 13.0–17.0)
LYMPHS PCT: 8 %
Lymphs Abs: 0.8 10*3/uL (ref 0.7–4.0)
MCH: 30.3 pg (ref 26.0–34.0)
MCHC: 36.1 g/dL — ABNORMAL HIGH (ref 30.0–36.0)
MCV: 83.9 fL (ref 78.0–100.0)
MONO ABS: 1 10*3/uL (ref 0.1–1.0)
Monocytes Relative: 10 %
NEUTROS PCT: 82 %
Neutro Abs: 7.9 10*3/uL — ABNORMAL HIGH (ref 1.7–7.7)
Platelets: 164 10*3/uL (ref 150–400)
RBC: 4.36 MIL/uL (ref 4.22–5.81)
RDW: 12.6 % (ref 11.5–15.5)
WBC: 9.7 10*3/uL (ref 4.0–10.5)

## 2018-01-30 LAB — COMPREHENSIVE METABOLIC PANEL
ALK PHOS: 73 U/L (ref 38–126)
ALT: 16 U/L (ref 0–44)
ANION GAP: 11 (ref 5–15)
AST: 26 U/L (ref 15–41)
Albumin: 2.9 g/dL — ABNORMAL LOW (ref 3.5–5.0)
BUN: 14 mg/dL (ref 6–20)
CO2: 26 mmol/L (ref 22–32)
Calcium: 8 mg/dL — ABNORMAL LOW (ref 8.9–10.3)
Chloride: 86 mmol/L — ABNORMAL LOW (ref 98–111)
Creatinine, Ser: 0.86 mg/dL (ref 0.61–1.24)
GFR calc non Af Amer: >60 mL/min (ref >60)
Glucose, Bld: 126 mg/dL — ABNORMAL HIGH (ref 70–99)
Potassium: 3.3 mmol/L — ABNORMAL LOW (ref 3.5–5.1)
SODIUM: 123 mmol/L — AB (ref 135–145)
Total Bilirubin: 1 mg/dL (ref 0.3–1.2)
Total Protein: 7.3 g/dL (ref 6.5–8.1)

## 2018-01-30 LAB — I-STAT CG4 LACTIC ACID, ED: LACTIC ACID, VENOUS: 1.44 mmol/L (ref 0.5–1.9)

## 2018-01-30 MED ORDER — MORPHINE SULFATE (PF) 4 MG/ML IV SOLN: 4.0000 mg | Freq: Once | INTRAVENOUS | Status: DC

## 2018-01-30 MED ORDER — VANCOMYCIN HCL 10 G IV SOLR
1500.0000 mg | Freq: Two times a day (BID) | INTRAVENOUS | Status: DC
  Administered 2018-01-30: 1500 mg via INTRAVENOUS
  Filled 2018-01-30: qty 1500

## 2018-01-30 MED ORDER — VANCOMYCIN HCL 500 MG IV SOLR
INTRAVENOUS | Status: AC
  Filled 2018-01-30: qty 500

## 2018-01-30 MED ORDER — PIPERACILLIN-TAZOBACTAM 3.375 G IVPB 30 MIN
3.3750 g | Freq: Once | INTRAVENOUS | Status: AC
  Administered 2018-01-30: 3.375 g via INTRAVENOUS
  Filled 2018-01-30 (×2): qty 50

## 2018-01-30 MED ORDER — SODIUM CHLORIDE 0.9 % IV SOLN
1250.0000 mg | Freq: Once | INTRAVENOUS | Status: DC
  Filled 2018-01-30: qty 1250

## 2018-01-30 MED ORDER — IOPAMIDOL (ISOVUE-370) INJECTION 76%
100.0000 mL | Freq: Once | INTRAVENOUS | Status: AC | PRN
  Administered 2018-01-30: 100 mL via INTRAVENOUS

## 2018-01-30 MED ORDER — OXYCODONE-ACETAMINOPHEN 5-325 MG PO TABS: 2.0000 | ORAL_TABLET | Freq: Once | ORAL | Status: DC

## 2018-01-30 MED ORDER — KETOROLAC TROMETHAMINE 30 MG/ML IJ SOLN
30.0000 mg | Freq: Once | INTRAMUSCULAR | Status: AC
  Administered 2018-01-30: 30 mg via INTRAVENOUS

## 2018-01-30 MED ORDER — VANCOMYCIN HCL 1000 MG IV SOLR
INTRAVENOUS | Status: AC
  Filled 2018-01-30: qty 1000

## 2018-01-30 MED ORDER — KETOROLAC TROMETHAMINE 30 MG/ML IJ SOLN
INTRAMUSCULAR | Status: AC
  Filled 2018-01-30: qty 1

## 2018-01-30 MED ORDER — VANCOMYCIN HCL IN DEXTROSE 1-5 GM/200ML-% IV SOLN
1000.0000 mg | Freq: Once | INTRAVENOUS | Status: DC
  Filled 2018-01-30: qty 200

## 2018-01-30 MED ORDER — ACETAMINOPHEN 500 MG PO TABS
ORAL_TABLET | ORAL | Status: AC
  Filled 2018-01-30: qty 2

## 2018-01-30 MED ORDER — ACETAMINOPHEN 500 MG PO TABS
1000.0000 mg | ORAL_TABLET | Freq: Once | ORAL | Status: AC
  Administered 2018-01-30: 1000 mg via ORAL

## 2018-01-30 MED ORDER — OXYCODONE HCL 5 MG PO TABS
10.0000 mg | ORAL_TABLET | Freq: Once | ORAL | Status: AC
  Administered 2018-01-30: 10 mg via ORAL
  Filled 2018-01-30: qty 2

## 2018-01-30 NOTE — ED Notes (Signed)
Pt refusing to be admitted, but will stay until Vancomycin is completed. Pt sts he "needs to go take care of this pain"; he feels he will be discriminated against because of his admitted IV drug use.

## 2018-01-30 NOTE — ED Notes (Signed)
Pt left before EDP was able to talk to him; no discharge papers/rx received; pt did not sign AMA document.

## 2018-01-30 NOTE — Progress Notes (Signed)
Pharmacy Antibiotic Note  Pedro MouldSean M Waddington is a 32 y.o. male admitted on 01/30/2018 with cellulitis.  Pharmacy has been consulted for vancomycin dosing.  Presenting with swelling and redness from recent injury, hx of IVDU, tachy in ED.  Zosyn 3.375g IV per MD.  SCr 0.86 and WBC wnl.    Plan: Vancomycin 1500 mg IV every 12 hours  Monitor renal function, clinical progression, Cx results  Height: 6' (182.9 cm) Weight: 185 lb (83.9 kg) IBW/kg (Calculated) : 77.6  Temp (24hrs), Avg:98.3 F (36.8 C), Min:98.3 F (36.8 C), Max:98.3 F (36.8 C)  Recent Labs  Lab 01/30/18 1445 01/30/18 1448  WBC 9.7  --   CREATININE 0.86  --   LATICACIDVEN  --  1.44    Estimated Creatinine Clearance: 132.8 mL/min (by C-G formula based on SCr of 0.86 mg/dL).    No Known Allergies  Antimicrobials this admission: vanc 7/9> Zosyn 7/9>  Dose adjustments this admission: n/a  Microbiology results: 7/9 BCx: sent  Daylene PoseyJonathan Jamesyn Lindell, PharmD Clinical Pharmacist [phone] 01/30/2018 3:23 PM

## 2018-01-30 NOTE — ED Notes (Signed)
Pt requests nurse ask EDP if he will prescribe oral abx; EDP (Dr. Ranae PalmsYelverton) is agreeable and sts he will speak to pt.

## 2018-01-30 NOTE — ED Notes (Signed)
Report to Calvin, RN at [facility].  

## 2018-01-30 NOTE — ED Notes (Signed)
Patient in Xray at this time.

## 2018-01-30 NOTE — ED Notes (Signed)
Attempted IV access x 2; unable to obtain access, but obtained labs for a set of BCxs with second stick.
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
## 2018-01-30 NOTE — ED Notes (Signed)
US at bedside

## 2018-01-31 ENCOUNTER — Other Ambulatory Visit: Payer: Self-pay

## 2018-01-31 ENCOUNTER — Encounter (HOSPITAL_COMMUNITY): Payer: Self-pay

## 2018-01-31 ENCOUNTER — Inpatient Hospital Stay (HOSPITAL_COMMUNITY)
Admission: EM | Admit: 2018-01-31 | Discharge: 2018-02-08 | DRG: 602 | Disposition: A | Discharge status: Home / Self Care | Payer: Self-pay | Attending: Family Medicine | Admitting: Family Medicine

## 2018-01-31 DIAGNOSIS — L0390 Cellulitis, unspecified: Secondary | ICD-10-CM | POA: Diagnosis present

## 2018-01-31 DIAGNOSIS — B9562 Methicillin resistant Staphylococcus aureus infection as the cause of diseases classified elsewhere: Secondary | ICD-10-CM | POA: Diagnosis present

## 2018-01-31 DIAGNOSIS — R768 Other specified abnormal immunological findings in serum: Secondary | ICD-10-CM | POA: Diagnosis present

## 2018-01-31 DIAGNOSIS — F1910 Other psychoactive substance abuse, uncomplicated: Secondary | ICD-10-CM | POA: Diagnosis present

## 2018-01-31 DIAGNOSIS — M5442 Lumbago with sciatica, left side: Secondary | ICD-10-CM | POA: Diagnosis present

## 2018-01-31 DIAGNOSIS — R7881 Bacteremia: Secondary | ICD-10-CM | POA: Diagnosis present

## 2018-01-31 DIAGNOSIS — E876 Hypokalemia: Secondary | ICD-10-CM | POA: Diagnosis present

## 2018-01-31 DIAGNOSIS — F17210 Nicotine dependence, cigarettes, uncomplicated: Secondary | ICD-10-CM | POA: Diagnosis present

## 2018-01-31 DIAGNOSIS — I82621 Acute embolism and thrombosis of deep veins of right upper extremity: Secondary | ICD-10-CM | POA: Diagnosis present

## 2018-01-31 DIAGNOSIS — R52 Pain, unspecified: Secondary | ICD-10-CM

## 2018-01-31 DIAGNOSIS — L03113 Cellulitis of right upper limb: Principal | ICD-10-CM | POA: Diagnosis present

## 2018-01-31 DIAGNOSIS — J189 Pneumonia, unspecified organism: Secondary | ICD-10-CM | POA: Diagnosis present

## 2018-01-31 DIAGNOSIS — I82611 Acute embolism and thrombosis of superficial veins of right upper extremity: Secondary | ICD-10-CM | POA: Diagnosis present

## 2018-01-31 DIAGNOSIS — B1920 Unspecified viral hepatitis C without hepatic coma: Secondary | ICD-10-CM | POA: Diagnosis present

## 2018-01-31 DIAGNOSIS — B955 Unspecified streptococcus as the cause of diseases classified elsewhere: Secondary | ICD-10-CM | POA: Diagnosis present

## 2018-01-31 DIAGNOSIS — F1110 Opioid abuse, uncomplicated: Secondary | ICD-10-CM | POA: Diagnosis present

## 2018-01-31 DIAGNOSIS — M545 Low back pain: Secondary | ICD-10-CM

## 2018-01-31 DIAGNOSIS — G4700 Insomnia, unspecified: Secondary | ICD-10-CM | POA: Diagnosis present

## 2018-01-31 DIAGNOSIS — R079 Chest pain, unspecified: Secondary | ICD-10-CM

## 2018-01-31 DIAGNOSIS — J181 Lobar pneumonia, unspecified organism: Secondary | ICD-10-CM | POA: Diagnosis present

## 2018-01-31 DIAGNOSIS — F1990 Other psychoactive substance use, unspecified, uncomplicated: Secondary | ICD-10-CM

## 2018-01-31 DIAGNOSIS — I82890 Acute embolism and thrombosis of other specified veins: Secondary | ICD-10-CM | POA: Diagnosis present

## 2018-01-31 DIAGNOSIS — B950 Streptococcus, group A, as the cause of diseases classified elsewhere: Secondary | ICD-10-CM | POA: Diagnosis present

## 2018-01-31 DIAGNOSIS — R918 Other nonspecific abnormal finding of lung field: Secondary | ICD-10-CM | POA: Diagnosis present

## 2018-01-31 DIAGNOSIS — I808 Phlebitis and thrombophlebitis of other sites: Secondary | ICD-10-CM | POA: Diagnosis present

## 2018-01-31 LAB — BLOOD CULTURE ID PANEL (REFLEXED)
Acinetobacter baumannii: NOT DETECTED
CANDIDA GLABRATA: NOT DETECTED
CANDIDA KRUSEI: NOT DETECTED
CANDIDA PARAPSILOSIS: NOT DETECTED
Candida albicans: NOT DETECTED
Candida tropicalis: NOT DETECTED
ENTEROBACTER CLOACAE COMPLEX: NOT DETECTED
ENTEROCOCCUS SPECIES: NOT DETECTED
ESCHERICHIA COLI: NOT DETECTED
Enterobacteriaceae species: NOT DETECTED
Haemophilus influenzae: NOT DETECTED
KLEBSIELLA OXYTOCA: NOT DETECTED
Klebsiella pneumoniae: NOT DETECTED
LISTERIA MONOCYTOGENES: NOT DETECTED
Neisseria meningitidis: NOT DETECTED
PSEUDOMONAS AERUGINOSA: NOT DETECTED
Proteus species: NOT DETECTED
SERRATIA MARCESCENS: NOT DETECTED
STAPHYLOCOCCUS AUREUS BCID: NOT DETECTED
STREPTOCOCCUS PNEUMONIAE: NOT DETECTED
STREPTOCOCCUS PYOGENES: DETECTED — AB
Staphylococcus species: NOT DETECTED
Streptococcus agalactiae: NOT DETECTED
Streptococcus species: DETECTED — AB

## 2018-01-31 LAB — COMPREHENSIVE METABOLIC PANEL
ALK PHOS: 67 U/L (ref 38–126)
ALT: 20 U/L (ref 0–44)
AST: 46 U/L — AB (ref 15–41)
Albumin: 2.6 g/dL — ABNORMAL LOW (ref 3.5–5.0)
Anion gap: 9 (ref 5–15)
BUN: 13 mg/dL (ref 6–20)
CALCIUM: 7.8 mg/dL — AB (ref 8.9–10.3)
CHLORIDE: 96 mmol/L — AB (ref 98–111)
CO2: 27 mmol/L (ref 22–32)
CREATININE: 0.86 mg/dL (ref 0.61–1.24)
Glucose, Bld: 102 mg/dL — ABNORMAL HIGH (ref 70–99)
Potassium: 6.3 mmol/L (ref 3.5–5.1)
Sodium: 132 mmol/L — ABNORMAL LOW (ref 135–145)
Total Bilirubin: 2.6 mg/dL — ABNORMAL HIGH (ref 0.3–1.2)
Total Protein: 5.9 g/dL — ABNORMAL LOW (ref 6.5–8.1)

## 2018-01-31 LAB — BASIC METABOLIC PANEL
Anion gap: 10 (ref 5–15)
BUN: 14 mg/dL (ref 6–20)
CHLORIDE: 99 mmol/L (ref 98–111)
CO2: 28 mmol/L (ref 22–32)
CREATININE: 0.82 mg/dL (ref 0.61–1.24)
Calcium: 8.1 mg/dL — ABNORMAL LOW (ref 8.9–10.3)
GFR calc non Af Amer: >60 mL/min (ref >60)
Glucose, Bld: 115 mg/dL — ABNORMAL HIGH (ref 70–99)
Potassium: 2.9 mmol/L — ABNORMAL LOW (ref 3.5–5.1)
SODIUM: 137 mmol/L (ref 135–145)

## 2018-01-31 LAB — CBC WITH DIFFERENTIAL/PLATELET
BASOS PCT: 1 %
Basophils Absolute: 0.1 10*3/uL (ref 0.0–0.1)
EOS ABS: 0 10*3/uL (ref 0.0–0.7)
Eosinophils Relative: 0 %
HCT: 38.5 % — ABNORMAL LOW (ref 39.0–52.0)
HEMOGLOBIN: 13.7 g/dL (ref 13.0–17.0)
LYMPHS PCT: 11 %
Lymphs Abs: 1.3 10*3/uL (ref 0.7–4.0)
MCH: 30 pg (ref 26.0–34.0)
MCHC: 35.6 g/dL (ref 30.0–36.0)
MCV: 84.4 fL (ref 78.0–100.0)
MONO ABS: 2.4 10*3/uL — AB (ref 0.1–1.0)
Monocytes Relative: 21 %
NEUTROS PCT: 67 %
Neutro Abs: 7.7 10*3/uL (ref 1.7–7.7)
PLATELETS: 212 10*3/uL (ref 150–400)
RBC: 4.56 MIL/uL (ref 4.22–5.81)
RDW: 13.4 % (ref 11.5–15.5)
WBC: 11.5 10*3/uL — ABNORMAL HIGH (ref 4.0–10.5)

## 2018-01-31 LAB — PROTIME-INR
INR: 1.08
Prothrombin Time: 13.9 seconds (ref 11.4–15.2)

## 2018-01-31 LAB — I-STAT CG4 LACTIC ACID, ED
Lactic Acid, Venous: 1.97 mmol/L — ABNORMAL HIGH (ref 0.5–1.9)
Lactic Acid, Venous: 0.99 mmol/L (ref 0.5–1.9)

## 2018-01-31 MED ORDER — VANCOMYCIN HCL 10 G IV SOLR
1250.0000 mg | Freq: Two times a day (BID) | INTRAVENOUS | Status: DC
  Administered 2018-02-01: 1250 mg via INTRAVENOUS
  Filled 2018-01-31: qty 1250

## 2018-01-31 MED ORDER — PIPERACILLIN-TAZOBACTAM 3.375 G IVPB
3.3750 g | Freq: Three times a day (TID) | INTRAVENOUS | Status: DC
  Administered 2018-02-01: 3.375 g via INTRAVENOUS
  Filled 2018-01-31: qty 50

## 2018-01-31 MED ORDER — RIVAROXABAN 20 MG PO TABS
20.0000 mg | ORAL_TABLET | Freq: Every day | ORAL | Status: DC
Start: 2018-02-22 — End: 2018-02-08

## 2018-01-31 MED ORDER — SODIUM CHLORIDE 0.9 % IV BOLUS
1000.0000 mL | Freq: Once | INTRAVENOUS | Status: AC
  Administered 2018-01-31: 1000 mL via INTRAVENOUS

## 2018-01-31 MED ORDER — ACETAMINOPHEN 325 MG PO TABS
650.0000 mg | ORAL_TABLET | Freq: Four times a day (QID) | ORAL | Status: DC | PRN
  Administered 2018-02-01: 650 mg via ORAL
  Filled 2018-01-31: qty 2

## 2018-01-31 MED ORDER — PIPERACILLIN-TAZOBACTAM 3.375 G IVPB 30 MIN
3.3750 g | Freq: Once | INTRAVENOUS | Status: AC
  Administered 2018-01-31: 3.375 g via INTRAVENOUS
  Filled 2018-01-31: qty 50

## 2018-01-31 MED ORDER — KETOROLAC TROMETHAMINE 30 MG/ML IJ SOLN
30.0000 mg | Freq: Once | INTRAMUSCULAR | Status: DC
  Filled 2018-01-31: qty 1

## 2018-01-31 MED ORDER — MORPHINE SULFATE (PF) 4 MG/ML IV SOLN
4.0000 mg | Freq: Once | INTRAVENOUS | Status: AC
  Administered 2018-01-31: 4 mg via INTRAVENOUS
  Filled 2018-01-31: qty 1

## 2018-01-31 MED ORDER — SODIUM CHLORIDE 0.9 % IV SOLN
INTRAVENOUS | Status: AC
  Administered 2018-02-01: 02:00:00 via INTRAVENOUS

## 2018-01-31 MED ORDER — ACETAMINOPHEN 650 MG RE SUPP: 650.0000 mg | Freq: Four times a day (QID) | RECTAL | Status: DC | PRN

## 2018-01-31 MED ORDER — ACETAMINOPHEN 500 MG PO TABS
1000.0000 mg | ORAL_TABLET | Freq: Once | ORAL | Status: AC
  Administered 2018-01-31: 1000 mg via ORAL
  Filled 2018-01-31: qty 2

## 2018-01-31 MED ORDER — RIVAROXABAN 15 MG PO TABS
15.0000 mg | ORAL_TABLET | ORAL | Status: AC
  Administered 2018-02-01: 15 mg via ORAL
  Filled 2018-01-31 (×2): qty 1

## 2018-01-31 MED ORDER — RIVAROXABAN 15 MG PO TABS
15.0000 mg | ORAL_TABLET | Freq: Two times a day (BID) | ORAL | Status: DC
  Administered 2018-02-01 – 2018-02-08 (×16): 15 mg via ORAL
  Filled 2018-01-31 (×16): qty 1

## 2018-01-31 MED ORDER — RIVAROXABAN 15 MG PO TABS
15.0000 mg | ORAL_TABLET | Freq: Once | ORAL | Status: AC
  Administered 2018-01-31: 15 mg via ORAL
  Filled 2018-01-31: qty 1

## 2018-01-31 MED ORDER — ONDANSETRON HCL 4 MG/2ML IJ SOLN: 4.0000 mg | Freq: Four times a day (QID) | INTRAMUSCULAR | Status: DC | PRN

## 2018-01-31 MED ORDER — VANCOMYCIN HCL 10 G IV SOLR
1500.0000 mg | Freq: Once | INTRAVENOUS | Status: AC
  Administered 2018-01-31: 1500 mg via INTRAVENOUS
  Filled 2018-01-31 (×2): qty 1500

## 2018-01-31 MED ORDER — ONDANSETRON HCL 4 MG PO TABS
4.0000 mg | ORAL_TABLET | Freq: Four times a day (QID) | ORAL | Status: DC | PRN
  Administered 2018-02-01: 4 mg via ORAL
  Filled 2018-01-31 (×2): qty 1
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
## 2018-01-31 NOTE — Progress Notes (Signed)
ANTICOAGULATION CONSULT NOTE - Initial Consult  Pharmacy Consult for xarelto Indication: UE DVT  No Known Allergies  Patient Measurements: Height: 6' (182.9 cm) Weight: 185 lb (83.9 kg) IBW/kg (Calculated) : 77.6   Vital Signs: Temp: 98.3 F (36.8 C) (07/10 1757) Temp Source: Oral (07/10 1757) BP: 115/64 (07/10 2200) Pulse Rate: 81 (07/10 2200)  Labs: Recent Labs    01/30/18 1445 01/31/18 1848 01/31/18 1929 01/31/18 2046 01/31/18 2144  HGB 13.2 13.7  --   --   --   HCT 36.6* 38.5*  --   --   --   PLT 164 212  --   --   --   LABPROT  --   --  13.9  --   --   INR  --   --  1.08  --   --   CREATININE 0.86  --   --  0.86 0.82    Estimated Creatinine Clearance: 139.3 mL/min (by C-G formula based on SCr of 0.82 mg/dL).   Medical History: Past Medical History:  Diagnosis Date  . Drug abuse (HCC)   . IV drug user     Medications:  Scheduled:  . [START ON 02/01/2018] Rivaroxaban  15 mg Oral BID WC  . rivaroxaban  15 mg Oral NOW  . [START ON 02/22/2018] rivaroxaban  20 mg Oral Daily   Infusions:  . sodium chloride    . [START ON 02/01/2018] piperacillin-tazobactam (ZOSYN)  IV    . [START ON 02/01/2018] vancomycin      Assessment: 32 yoM with hx of IV drug abuse admitted with swelling and erythema of right UE.  Patient is + for occlusive DVT involving the central aspect of one of the paired brachial veins as well as the radial vein.  Xarelto per Rx for UE DVT   Plan:  Xarelto 15 mg bid x1 21 days  Then Xarelto 20 mg daily Provide patient education  Lorenza EvangelistGreen, Pedro Erbes R 01/31/2018,11:33 PM
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
## 2018-01-31 NOTE — ED Provider Notes (Signed)
Medical screening examination/treatment/procedure(s) were conducted as a shared visit with non-physician practitioner(s) and myself.  I personally evaluated the patient during the encounter.  Patient presents to the hospital for evaluation of known cellulitis associated with IV drug abuse.  Patient was supposed to be admitted the other evening but he left AMA.  Patient exam is consistent with cellulitis.  He does not appear septic or toxic.  IV antibiotics have been started.  Plan on admission to the hospital for further evaluation.   Linwood DibblesKnapp, Zaniyah Wernette, MD 01/31/18 2156
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
## 2018-02-01 ENCOUNTER — Other Ambulatory Visit: Payer: Self-pay

## 2018-02-01 ENCOUNTER — Encounter (HOSPITAL_COMMUNITY): Payer: Self-pay | Admitting: *Deleted

## 2018-02-01 ENCOUNTER — Inpatient Hospital Stay (HOSPITAL_COMMUNITY): Payer: Self-pay

## 2018-02-01 DIAGNOSIS — I82621 Acute embolism and thrombosis of deep veins of right upper extremity: Secondary | ICD-10-CM

## 2018-02-01 DIAGNOSIS — R110 Nausea: Secondary | ICD-10-CM

## 2018-02-01 DIAGNOSIS — R7881 Bacteremia: Secondary | ICD-10-CM

## 2018-02-01 DIAGNOSIS — J189 Pneumonia, unspecified organism: Secondary | ICD-10-CM

## 2018-02-01 DIAGNOSIS — I808 Phlebitis and thrombophlebitis of other sites: Secondary | ICD-10-CM

## 2018-02-01 DIAGNOSIS — F17210 Nicotine dependence, cigarettes, uncomplicated: Secondary | ICD-10-CM

## 2018-02-01 DIAGNOSIS — F1110 Opioid abuse, uncomplicated: Secondary | ICD-10-CM

## 2018-02-01 DIAGNOSIS — B950 Streptococcus, group A, as the cause of diseases classified elsewhere: Secondary | ICD-10-CM

## 2018-02-01 DIAGNOSIS — R197 Diarrhea, unspecified: Secondary | ICD-10-CM

## 2018-02-01 DIAGNOSIS — R079 Chest pain, unspecified: Secondary | ICD-10-CM

## 2018-02-01 DIAGNOSIS — E876 Hypokalemia: Secondary | ICD-10-CM

## 2018-02-01 DIAGNOSIS — R918 Other nonspecific abnormal finding of lung field: Secondary | ICD-10-CM

## 2018-02-01 DIAGNOSIS — B955 Unspecified streptococcus as the cause of diseases classified elsewhere: Secondary | ICD-10-CM | POA: Diagnosis present

## 2018-02-01 LAB — BASIC METABOLIC PANEL
Anion gap: 8 (ref 5–15)
BUN: 11 mg/dL (ref 6–20)
CALCIUM: 8.1 mg/dL — AB (ref 8.9–10.3)
CHLORIDE: 105 mmol/L (ref 98–111)
CO2: 25 mmol/L (ref 22–32)
CREATININE: 0.67 mg/dL (ref 0.61–1.24)
Glucose, Bld: 112 mg/dL — ABNORMAL HIGH (ref 70–99)
Potassium: 3.6 mmol/L (ref 3.5–5.1)
SODIUM: 138 mmol/L (ref 135–145)

## 2018-02-01 LAB — ECHOCARDIOGRAM COMPLETE
Height: 71 in
WEIGHTICAEL: 2960 [oz_av]

## 2018-02-01 LAB — CBC
HCT: 35.1 % — ABNORMAL LOW (ref 39.0–52.0)
Hemoglobin: 12.3 g/dL — ABNORMAL LOW (ref 13.0–17.0)
MCH: 29.9 pg (ref 26.0–34.0)
MCHC: 35 g/dL (ref 30.0–36.0)
MCV: 85.2 fL (ref 78.0–100.0)
PLATELETS: 232 10*3/uL (ref 150–400)
RBC: 4.12 MIL/uL — AB (ref 4.22–5.81)
RDW: 13.5 % (ref 11.5–15.5)
WBC: 9.3 10*3/uL (ref 4.0–10.5)

## 2018-02-01 LAB — MAGNESIUM: MAGNESIUM: 2.1 mg/dL (ref 1.7–2.4)

## 2018-02-01 LAB — CK: CK TOTAL: 14 U/L — AB (ref 49–397)

## 2018-02-01 LAB — TROPONIN I

## 2018-02-01 MED ORDER — AZITHROMYCIN 250 MG PO TABS: 500.0000 mg | ORAL_TABLET | Freq: Every day | ORAL | Status: DC

## 2018-02-01 MED ORDER — NAPROXEN 500 MG PO TABS
500.0000 mg | ORAL_TABLET | Freq: Two times a day (BID) | ORAL | Status: AC | PRN
  Administered 2018-02-01 – 2018-02-04 (×2): 500 mg via ORAL
  Filled 2018-02-01 (×3): qty 1

## 2018-02-01 MED ORDER — NICOTINE 21 MG/24HR TD PT24
21.0000 mg | MEDICATED_PATCH | Freq: Every day | TRANSDERMAL | Status: DC
  Administered 2018-02-01 – 2018-02-06 (×4): 21 mg via TRANSDERMAL
  Filled 2018-02-01 (×7): qty 1

## 2018-02-01 MED ORDER — ONDANSETRON 4 MG PO TBDP: 4.0000 mg | ORAL_TABLET | Freq: Four times a day (QID) | ORAL | Status: DC | PRN

## 2018-02-01 MED ORDER — DICYCLOMINE HCL 20 MG PO TABS
20.0000 mg | ORAL_TABLET | Freq: Four times a day (QID) | ORAL | Status: AC | PRN
  Filled 2018-02-01: qty 1

## 2018-02-01 MED ORDER — POTASSIUM CHLORIDE CRYS ER 20 MEQ PO TBCR
60.0000 meq | EXTENDED_RELEASE_TABLET | Freq: Once | ORAL | Status: AC
  Administered 2018-02-01: 60 meq via ORAL
  Filled 2018-02-01: qty 3

## 2018-02-01 MED ORDER — SODIUM CHLORIDE 0.9 % IV BOLUS
1000.0000 mL | Freq: Once | INTRAVENOUS | Status: AC
  Administered 2018-02-01: 1000 mL via INTRAVENOUS

## 2018-02-01 MED ORDER — SENNOSIDES-DOCUSATE SODIUM 8.6-50 MG PO TABS
1.0000 | ORAL_TABLET | Freq: Two times a day (BID) | ORAL | Status: DC
  Administered 2018-02-02 – 2018-02-06 (×4): 1 via ORAL
  Filled 2018-02-01 (×10): qty 1

## 2018-02-01 MED ORDER — IBUPROFEN 800 MG PO TABS
800.0000 mg | ORAL_TABLET | Freq: Three times a day (TID) | ORAL | Status: AC
  Administered 2018-02-01 – 2018-02-03 (×7): 800 mg via ORAL
  Filled 2018-02-01 (×8): qty 1

## 2018-02-01 MED ORDER — CLONIDINE HCL 0.1 MG PO TABS: 0.1000 mg | ORAL_TABLET | Freq: Every day | ORAL | Status: DC

## 2018-02-01 MED ORDER — BUPRENORPHINE HCL-NALOXONE HCL 8-2 MG SL SUBL
1.0000 | SUBLINGUAL_TABLET | Freq: Two times a day (BID) | SUBLINGUAL | Status: DC
  Administered 2018-02-02 – 2018-02-07 (×11): 1 via SUBLINGUAL
  Filled 2018-02-01 (×12): qty 1

## 2018-02-01 MED ORDER — CLONIDINE HCL 0.1 MG PO TABS
0.1000 mg | ORAL_TABLET | Freq: Four times a day (QID) | ORAL | Status: DC
  Administered 2018-02-01: 0.1 mg via ORAL
  Filled 2018-02-01: qty 1

## 2018-02-01 MED ORDER — PANTOPRAZOLE SODIUM 40 MG PO TBEC
40.0000 mg | DELAYED_RELEASE_TABLET | Freq: Every day | ORAL | Status: DC
Start: 2018-02-01 — End: 2018-02-08
  Administered 2018-02-01 – 2018-02-07 (×7): 40 mg via ORAL
  Filled 2018-02-01 (×9): qty 1

## 2018-02-01 MED ORDER — KETOROLAC TROMETHAMINE 15 MG/ML IJ SOLN
15.0000 mg | Freq: Four times a day (QID) | INTRAMUSCULAR | Status: DC | PRN
  Administered 2018-02-01 (×2): 15 mg via INTRAVENOUS
  Filled 2018-02-01 (×2): qty 1

## 2018-02-01 MED ORDER — KETOROLAC TROMETHAMINE 15 MG/ML IJ SOLN: 30.0000 mg | Freq: Four times a day (QID) | INTRAMUSCULAR | Status: DC | PRN

## 2018-02-01 MED ORDER — POTASSIUM CHLORIDE CRYS ER 20 MEQ PO TBCR
40.0000 meq | EXTENDED_RELEASE_TABLET | Freq: Once | ORAL | Status: AC
  Administered 2018-02-01: 40 meq via ORAL
  Filled 2018-02-01: qty 2

## 2018-02-01 MED ORDER — LOPERAMIDE HCL 2 MG PO CAPS: 2.0000 mg | ORAL_CAPSULE | ORAL | Status: AC | PRN

## 2018-02-01 MED ORDER — LEVOFLOXACIN IN D5W 750 MG/150ML IV SOLN
750.0000 mg | INTRAVENOUS | Status: DC
  Administered 2018-02-01: 750 mg via INTRAVENOUS
  Filled 2018-02-01: qty 150

## 2018-02-01 MED ORDER — METHOCARBAMOL 500 MG PO TABS
500.0000 mg | ORAL_TABLET | Freq: Three times a day (TID) | ORAL | Status: AC | PRN
  Administered 2018-02-01 – 2018-02-05 (×3): 500 mg via ORAL
  Filled 2018-02-01 (×3): qty 1

## 2018-02-01 MED ORDER — MORPHINE SULFATE (PF) 2 MG/ML IV SOLN
2.0000 mg | INTRAVENOUS | Status: DC | PRN
  Administered 2018-02-06 – 2018-02-08 (×3): 4 mg via INTRAVENOUS
  Filled 2018-02-01 (×2): qty 2

## 2018-02-01 MED ORDER — IOPAMIDOL (ISOVUE-300) INJECTION 61%
INTRAVENOUS | Status: AC
  Filled 2018-02-01: qty 100

## 2018-02-01 MED ORDER — SODIUM CHLORIDE 0.9 % IV SOLN
2.0000 g | INTRAVENOUS | Status: DC
  Administered 2018-02-01 – 2018-02-03 (×3): 2 g via INTRAVENOUS
  Filled 2018-02-01 (×3): qty 2

## 2018-02-01 MED ORDER — IOPAMIDOL (ISOVUE-300) INJECTION 61%
100.0000 mL | Freq: Once | INTRAVENOUS | Status: AC | PRN
  Administered 2018-02-01: 100 mL via INTRAVENOUS

## 2018-02-01 MED ORDER — POLYETHYLENE GLYCOL 3350 17 G PO PACK
17.0000 g | PACK | Freq: Every day | ORAL | Status: DC
  Administered 2018-02-05: 17 g via ORAL
  Filled 2018-02-01 (×5): qty 1

## 2018-02-01 MED ORDER — HYDROXYZINE HCL 25 MG PO TABS
25.0000 mg | ORAL_TABLET | Freq: Four times a day (QID) | ORAL | Status: AC | PRN
  Administered 2018-02-01 – 2018-02-05 (×4): 25 mg via ORAL
  Filled 2018-02-01 (×5): qty 1

## 2018-02-01 MED ORDER — IBUPROFEN 800 MG PO TABS: 800.0000 mg | ORAL_TABLET | Freq: Three times a day (TID) | ORAL | Status: DC

## 2018-02-01 MED ORDER — ZOLPIDEM TARTRATE 5 MG PO TABS
5.0000 mg | ORAL_TABLET | Freq: Once | ORAL | Status: AC
  Administered 2018-02-01: 5 mg via ORAL
  Filled 2018-02-01: qty 1

## 2018-02-01 MED ORDER — CLONIDINE HCL 0.1 MG PO TABS: 0.1000 mg | ORAL_TABLET | ORAL | Status: DC

## 2018-02-01 NOTE — Plan of Care (Signed)
  Problem: Activity: Goal: Risk for activity intolerance will decrease Outcome: Progressing   Problem: Nutrition: Goal: Adequate nutrition will be maintained Outcome: Progressing   Problem: Pain Managment: Goal: General experience of comfort will improve Outcome: Progressing   Problem: Safety: Goal: Ability to remain free from injury will improve Outcome: Progressing   

## 2018-02-01 NOTE — Progress Notes (Signed)
02/01/18 @ 0750 During report I noticed that the IV pump was programed at 12.5 with zosyn running across the screen but no secondary tubing or zosyn bag. Night RN was in the room and stated that she did not remove the Zosyn and tubing. Night RN found bag of Zosyn in trash. Zosyn was not due to stop infusing until 0914. I called Belma who I had since in the room during shift change states that the secondary tube was disconnected from the primary tubing and the Zosyn bag was empty and she trashed the Zosyn and tubing. Notified pharmacy that patient may or may not have recieved all of Zosyn and if he had received it, it infused over possible 2 hours.

## 2018-02-01 NOTE — Progress Notes (Signed)
Pharmacy Antibiotic Note  Pedro Peterson is a 32 y.o. male admitted on 01/31/2018 with pneumonia.  Pharmacy has been consulted for levaquin dosing.  Plan: Levaquin 750 mg IV q24h F/u scr/cultures  Height: 5\' 11"  (180.3 cm) Weight: 185 lb (83.9 kg) IBW/kg (Calculated) : 75.3  Temp (24hrs), Avg:98.5 F (36.9 C), Min:98.3 F (36.8 C), Max:98.7 F (37.1 C)  Recent Labs  Lab 01/30/18 1445 01/30/18 1448 01/31/18 1848 01/31/18 1900 01/31/18 2046 01/31/18 2053 01/31/18 2144  WBC 9.7  --  11.5*  --   --   --   --   CREATININE 0.86  --   --   --  0.86  --  0.82  LATICACIDVEN  --  1.44  --  1.97*  --  0.99  --     Estimated Creatinine Clearance: 135.2 mL/min (by C-G formula based on SCr of 0.82 mg/dL).    No Known Allergies  Antimicrobials this admission: 7/10 zosyn >>  7/10 vancomycin >>  7/11 levaquin >>  Dose adjustments this admission:   Microbiology results:  BCx:   UCx:    Sputum:    MRSA PCR:   Thank you for allowing pharmacy to be a part of this patient's care.  Lorenza EvangelistGreen, Alaylah Heatherington R 02/01/2018 2:02 AM

## 2018-02-01 NOTE — Consult Note (Addendum)
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 lower arm. EXAM: RIGHT HUMERUS - 2+ VIEW COMPARISON:  None in PACs FINDINGS: The humerus is subjectively adequately mineralized. There is no acute or healing fracture. The observed portions of the right shoulder and right elbow exhibit no acute abnormalities. The soft tissues of the arm are unremarkable. IMPRESSION: There is no acute bony abnormality of the right humerus. The surrounding soft tissues are unremarkable. Electronically Signed   By: David  JMartiniqueM.D.   On: 01/30/2018 14:18   Recent Results (from the past 240 hour(s))  Blood culture (routine x 2)     Status:  Abnormal (Preliminary result)   Collection Time: 01/30/18  2:30 PM  Result Value Ref Range Status   Specimen Description   Final    BLOOD BLOOD LEFT FOREARM Performed at [facility] [address], [place]   Special Requests   Final    BOTTLES DRAWN AEROBIC AND ANAEROBIC Blood Culture results may not be optimal due to an inadequate volume of blood received in culture bottles [facility]formed at [facility] [address], [place]   Culture  Setup Time   Final    GRAM POSITIVE COCCI IN CHAINS IN BOTH AEROBIC AND ANAEROBIC BOTTLES    Culture (A)  Final    GROUP A STREP (S.PYOGENES) ISOLATED SUSCEPTIBILITIES TO FOLLOW Performed at [facility] [address] [place] [address]   Report Status PENDING  Incomplete  Blood culture (routine x 2)     Status: None (Preliminary result)   Collection Time: 01/30/18  2:44 PM  Result Value Ref  Range Status   Specimen Description   Final    BLOOD UPPER BLOOD LEFT ARM Performed at [facility], [address] [place], [place] [address]    Special Requests   Final    BOTTLES DRAWN AEROBIC AND ANAEROBIC Blood Culture adequate volume Performed at [facility], [address] [place], [place] [address]    Culture  Setup Time   Final    IN BOTH AEROBIC AND ANAEROBIC BOTTLES GRAM POSITIVE COCCI IN CHAINS CRITICAL RESULT CALLED TO, READ BACK BY AND VERIFIED WITH: RN D MERRITT [id number] MLM Performed at [facility], [place] [address], [place], [place] [address]    Culture GRAM POSITIVE COCCI  Final   Report Status PENDING  Incomplete  Blood Culture ID Panel (Reflexed)     Status: Abnormal   Collection Time: 01/30/18  2:44 PM  Result Value Ref Range Status   Enterococcus species NOT DETECTED NOT DETECTED Final   Listeria monocytogenes NOT DETECTED NOT DETECTED Final   Staphylococcus species NOT DETECTED NOT DETECTED Final   Staphylococcus  aureus NOT DETECTED NOT DETECTED Final   Streptococcus species DETECTED (A) NOT DETECTED Final    Comment: CRITICAL RESULT CALLED TO, READ BACK BY AND VERIFIED WITH: RN D MERRITT [id number] MLM    Streptococcus agalactiae NOT DETECTED NOT DETECTED Final   Streptococcus pneumoniae NOT DETECTED NOT DETECTED Final   Streptococcus pyogenes DETECTED (A) NOT DETECTED Final    Comment: CRITICAL RESULT CALLED TO, READ BACK BY AND VERIFIED WITH: RN D MERRITT [id number] MLM    Acinetobacter baumannii NOT DETECTED NOT DETECTED Final   Enterobacteriaceae species NOT DETECTED NOT DETECTED Final   Enterobacter cloacae complex NOT DETECTED NOT DETECTED Final   Escherichia coli NOT DETECTED NOT DETECTED Final   Klebsiella oxytoca NOT DETECTED NOT DETECTED Final   Klebsiella pneumoniae NOT DETECTED NOT DETECTED Final   Proteus species NOT DETECTED NOT DETECTED Final   Serratia marcescens NOT DETECTED NOT DETECTED Final   Haemophilus influenzae NOT DETECTED NOT DETECTED Final   Neisseria meningitidis NOT DETECTED NOT DETECTED Final   Pseudomonas aeruginosa NOT DETECTED NOT DETECTED Final   Candida albicans NOT DETECTED NOT DETECTED Final   Candida glabrata NOT DETECTED NOT DETECTED Final   Candida krusei NOT DETECTED NOT DETECTED Final   Candida parapsilosis NOT DETECTED NOT DETECTED Final   Candida tropicalis NOT DETECTED NOT DETECTED Final    Comment: Performed at [facility], [address] [place], [place] [address]  Culture, blood (routine x 2)     Status: None (Preliminary result)   Collection Time: 01/31/18  6:51 PM  Result Value Ref Range Status   Specimen Description   Final    BLOOD LEFT FOREARM Performed at [place] [address], [place], [place] [address]    Special Requests   Final    BOTTLES DRAWN AEROBIC AND ANAEROBIC Blood Culture adequate volume Performed at [place] [address], [place], [place] [address]    Culture    Final    NO GROWTH < 24 HOURS Performed at [place] [address], [place], [place] [address]    Report Status PENDING  Incomplete  Culture, blood (routine x 2)     Status: None (Preliminary result)   Collection Time: 01/31/18  7:18 PM  Result Value Ref Range Status   Specimen Description   Final    BLOOD  LEFT WRIST Performed at [facility], [place] [address] [place], [place] [address]    Special Requests   Final    BOTTLES DRAWN AEROBIC AND ANAEROBIC Blood Culture results may not be optimal due to an inadequate volume of blood received in culture bottles Performed at [place] [address] [place], [place] [address]    Culture   Final    NO GROWTH < 24 HOURS Performed at [place] [address] [place], [place] [address]    Report Status PENDING  Incomplete    Microbiology: Recent Results (from the past 240 hour(s))  Blood culture (routine x 2)     Status: Abnormal (Preliminary result)   Collection Time: 01/30/18  2:30 PM  Result Value Ref Range Status   Specimen Description   Final    BLOOD BLOOD LEFT FOREARM Performed at [facility], [address] [place], [place] [address]    Special Requests   Final    BOTTLES DRAWN AEROBIC AND ANAEROBIC Blood Culture results may not be optimal due to an inadequate volume of blood received in culture bottles Performed at [facility], [address] [place], [place] [address]    Culture  Setup Time   Final    GRAM POSITIVE COCCI IN CHAINS IN BOTH AEROBIC AND ANAEROBIC BOTTLES    Culture (A)  Final    GROUP A STREP (S.PYOGENES) ISOLATED SUSCEPTIBILITIES TO FOLLOW Performed at [facility], [place] [address], [place], [place] [address]    Report Status PENDING  Incomplete  Blood culture (routine x 2)     Status: None (Preliminary result)   Collection Time: 01/30/18  2:44 PM  Result Value Ref Range Status   Specimen Description   Final     BLOOD UPPER BLOOD LEFT ARM Performed at [facility], [address] [place], [place] [address]    Special Requests   Final    BOTTLES DRAWN AEROBIC AND ANAEROBIC Blood Culture adequate volume Performed at [facility], [address] [place], [place] [address]    Culture  Setup Time   Final    IN BOTH AEROBIC AND ANAEROBIC BOTTLES GRAM POSITIVE COCCI IN CHAINS CRITICAL RESULT CALLED TO, READ BACK BY AND VERIFIED WITH: RN D MERRITT [id number] MLM Performed at [facility], [place] [address] [place], [place] [address]    Culture GRAM POSITIVE COCCI  Final   Report Status PENDING  Incomplete  Blood Culture ID Panel (Reflexed)     Status: Abnormal   Collection Time: 01/30/18  2:44 PM  Result Value Ref Range Status   Enterococcus species NOT DETECTED NOT DETECTED Final   Listeria monocytogenes NOT DETECTED NOT DETECTED Final   Staphylococcus species NOT DETECTED NOT DETECTED Final   Staphylococcus aureus NOT DETECTED NOT DETECTED Final   Streptococcus species DETECTED (A) NOT DETECTED Final    Comment: CRITICAL RESULT CALLED TO, READ BACK BY AND VERIFIED WITH: RN D MERRITT [id number] MLM    Streptococcus agalactiae NOT DETECTED NOT DETECTED Final   Streptococcus pneumoniae NOT DETECTED NOT DETECTED Final   Streptococcus pyogenes DETECTED (A) NOT DETECTED Final    Comment: CRITICAL RESULT CALLED TO, READ BACK BY AND VERIFIED WITH: RN D MERRITT [id number] MLM    Acinetobacter baumannii NOT DETECTED NOT DETECTED Final   Enterobacteriaceae species NOT DETECTED NOT DETECTED Final   Enterobacter cloacae complex  NOT DETECTED NOT DETECTED Final   Escherichia coli NOT DETECTED NOT DETECTED Final   Klebsiella oxytoca NOT DETECTED NOT DETECTED Final   Klebsiella pneumoniae NOT DETECTED NOT DETECTED Final   Proteus species NOT DETECTED NOT DETECTED Final   Serratia marcescens NOT DETECTED NOT DETECTED Final   Haemophilus influenzae NOT DETECTED NOT DETECTED  Final   Neisseria meningitidis NOT DETECTED NOT DETECTED Final   Pseudomonas aeruginosa NOT DETECTED NOT DETECTED Final   Candida albicans NOT DETECTED NOT DETECTED Final   Candida glabrata NOT DETECTED NOT DETECTED Final   Candida krusei NOT DETECTED NOT DETECTED Final   Candida parapsilosis NOT DETECTED NOT DETECTED Final   Candida tropicalis NOT DETECTED NOT DETECTED Final    Comment: Performed at [facility], [place] [address], [place], [place] [address]  Culture, blood (routine x 2)     Status: None (Preliminary result)   Collection Time: 01/31/18  6:51 PM  Result Value Ref Range Status   Specimen Description   Final    BLOOD LEFT FOREARM Performed at [place] [address] [place], [place] [address]    Special Requests   Final    BOTTLES DRAWN AEROBIC AND ANAEROBIC Blood Culture adequate volume Performed at [place] [address] [place], [place] [address]    Culture   Final    NO GROWTH < 24 HOURS Performed at [facility] [address] [place], [place] [address]    Report Status PENDING  Incomplete  Culture, blood (routine x 2)     Status: None (Preliminary result)   Collection Time: 01/31/18  7:18 PM  Result Value Ref Range Status   Specimen Description   Final    BLOOD LEFT WRIST Performed at [place] [address], [place], [place] [address]    Special Requests   Final    BOTTLES DRAWN AEROBIC AND ANAEROBIC Blood Culture results may not be optimal due to an inadequate volume of blood received in culture bottles Performed at [facility] [address] [place], [place] [address]    Culture   Final    NO GROWTH < 24 HOURS Performed at [place] [address], [place], [place] [address]    Report Status PENDING  Incomplete    Radiographs and labs were personally reviewed by me.   Bobby Rumpf, MD [facility] [phone] 02/01/2018, 1:31 PM
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
## 2018-02-01 NOTE — Progress Notes (Signed)
02/01/18  1100  Ortho Tech came to patients room and applied an arm sling that hangs on an IV pole to keep patient's right arm elevated to decrease swelling per MD orders. Patient continues to take arm out of sling. Sling has been reapplied twice.

## 2018-02-01 NOTE — Progress Notes (Signed)
  Echocardiogram 2D Echocardiogram has been performed.  Husam Hohn T Dominque Marlin 02/01/2018, 3:38 PM

## 2018-02-02 DIAGNOSIS — F1910 Other psychoactive substance abuse, uncomplicated: Secondary | ICD-10-CM

## 2018-02-02 DIAGNOSIS — B1920 Unspecified viral hepatitis C without hepatic coma: Secondary | ICD-10-CM

## 2018-02-02 LAB — BASIC METABOLIC PANEL
ANION GAP: 7 (ref 5–15)
BUN: 7 mg/dL (ref 6–20)
CHLORIDE: 109 mmol/L (ref 98–111)
CO2: 26 mmol/L (ref 22–32)
CREATININE: 0.81 mg/dL (ref 0.61–1.24)
Calcium: 8.2 mg/dL — ABNORMAL LOW (ref 8.9–10.3)
Glucose, Bld: 111 mg/dL — ABNORMAL HIGH (ref 70–99)
POTASSIUM: 3.1 mmol/L — AB (ref 3.5–5.1)
SODIUM: 142 mmol/L (ref 135–145)

## 2018-02-02 LAB — CBC WITH DIFFERENTIAL/PLATELET
BASOS ABS: 0 10*3/uL (ref 0.0–0.1)
BASOS PCT: 0 %
Eosinophils Absolute: 0 10*3/uL (ref 0.0–0.7)
Eosinophils Relative: 0 %
HCT: 33.5 % — ABNORMAL LOW (ref 39.0–52.0)
Hemoglobin: 11.6 g/dL — ABNORMAL LOW (ref 13.0–17.0)
Lymphocytes Relative: 24 %
Lymphs Abs: 2.3 10*3/uL (ref 0.7–4.0)
MCH: 29.7 pg (ref 26.0–34.0)
MCHC: 34.6 g/dL (ref 30.0–36.0)
MCV: 85.9 fL (ref 78.0–100.0)
Monocytes Absolute: 1.1 10*3/uL (ref 0.1–1.0)
Monocytes Relative: 11 %
NEUTROS PCT: 65 %
Neutro Abs: 6.1 10*3/uL (ref 1.7–7.7)
Platelets: 276 10*3/uL (ref 150–400)
RBC: 3.9 MIL/uL — AB (ref 4.22–5.81)
RDW: 14 % (ref 11.5–15.5)
WBC: 9.5 10*3/uL (ref 4.0–10.5)

## 2018-02-02 LAB — HIV ANTIBODY (ROUTINE TESTING W REFLEX): HIV SCREEN 4TH GENERATION: NONREACTIVE

## 2018-02-02 LAB — CULTURE, BLOOD (ROUTINE X 2)

## 2018-02-02 LAB — HEPATITIS PANEL, ACUTE
HCV Ab: >11 s/co ratio — ABNORMAL HIGH (ref 0.0–0.9)
HEP A IGM: NEGATIVE
HEP B C IGM: NEGATIVE
HEP B S AG: NEGATIVE

## 2018-02-02 LAB — MAGNESIUM: MAGNESIUM: 1.8 mg/dL (ref 1.7–2.4)

## 2018-02-02 MED ORDER — POTASSIUM CHLORIDE CRYS ER 20 MEQ PO TBCR
40.0000 meq | EXTENDED_RELEASE_TABLET | ORAL | Status: AC
  Administered 2018-02-02: 40 meq via ORAL
  Filled 2018-02-02 (×2): qty 2

## 2018-02-02 MED ORDER — PANTOPRAZOLE SODIUM 40 MG PO TBEC: 40.0000 mg | DELAYED_RELEASE_TABLET | Freq: Every day | ORAL | Status: DC

## 2018-02-02 MED ORDER — ZOLPIDEM TARTRATE 5 MG PO TABS
5.0000 mg | ORAL_TABLET | Freq: Every evening | ORAL | Status: DC | PRN
  Administered 2018-02-03: 5 mg via ORAL
  Filled 2018-02-02: qty 1

## 2018-02-02 NOTE — Progress Notes (Signed)
    [facility] has been requested to perform a transesophageal echocardiogram on 07/15 for bacteremia.  After careful review of history and examination, the risks and benefits of transesophageal echocardiogram have been explained including risks of esophageal damage, perforation (1:10,000 risk), bleeding, pharyngeal hematoma as well as other potential complications associated with conscious sedation including aspiration, arrhythmia, respiratory failure and death. Alternatives to treatment were discussed, questions were answered. Patient is willing to proceed.   Scheduled 02/05/2018 at noon w/ Dr Verne Carrowandolph  Ric Rosenberg, PA-C 02/02/2018 9:14 AM
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
## 2018-02-02 NOTE — Progress Notes (Signed)
Patient is scheduled for [facility] TEE @ 1200 [facility]15.[facility]. Patient will need to arrive via carelink by 1030 to [facility]. Spoke with Rosey Batheresa, RN to set up carelink transport.

## 2018-02-02 NOTE — Plan of Care (Signed)
Patient is able to verbalize an overall understanding and moderate information regarding his plan of care. Patient plans to take medications as ordered, discussed importance of eliminating infection and preventing future. Patient verbalized increase activity as tolerated and ordered. Discussed pt anxiety, discussed presence of anxiety and fear of unknown, discussed medications to aid in reducing it, as well as the root of the feeling, and interventions. Educated performed on medications scheduled and PRNs, writing out thoughts and worries, offered reading material patient refused at this time. Pain is manageable and patient educated on medication and different divisional activities. Patient open and willing to comply at this time.
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
## 2018-02-03 ENCOUNTER — Telehealth: Payer: Self-pay

## 2018-02-03 DIAGNOSIS — R768 Other specified abnormal immunological findings in serum: Secondary | ICD-10-CM | POA: Diagnosis present

## 2018-02-03 DIAGNOSIS — I808 Phlebitis and thrombophlebitis of other sites: Secondary | ICD-10-CM | POA: Diagnosis present

## 2018-02-03 DIAGNOSIS — R7881 Bacteremia: Secondary | ICD-10-CM | POA: Diagnosis present

## 2018-02-03 LAB — CBC WITH DIFFERENTIAL/PLATELET
BASOS PCT: 0 %
Basophils Absolute: 0 10*3/uL (ref 0.0–0.1)
EOS PCT: 3 %
Eosinophils Absolute: 0.3 10*3/uL (ref 0.0–0.7)
HEMATOCRIT: 33.5 % — AB (ref 39.0–52.0)
HEMOGLOBIN: 11.5 g/dL — AB (ref 13.0–17.0)
Lymphocytes Relative: 27 %
Lymphs Abs: 2.9 10*3/uL (ref 0.7–4.0)
MCH: 29.9 pg (ref 26.0–34.0)
MCHC: 34.3 g/dL (ref 30.0–36.0)
MCV: 87 fL (ref 78.0–100.0)
MONOS PCT: 9 %
Monocytes Absolute: 1 10*3/uL (ref 0.1–1.0)
Myelocytes: 1 %
Neutro Abs: 6.6 10*3/uL (ref 1.7–7.7)
Neutrophils Relative %: 60 %
Platelets: 367 10*3/uL (ref 150–400)
RBC: 3.85 MIL/uL — AB (ref 4.22–5.81)
RDW: 14.1 % (ref 11.5–15.5)
WBC: 10.8 10*3/uL — AB (ref 4.0–10.5)

## 2018-02-03 LAB — BASIC METABOLIC PANEL
ANION GAP: 7 (ref 5–15)
BUN: 6 mg/dL (ref 6–20)
CHLORIDE: 107 mmol/L (ref 98–111)
CO2: 26 mmol/L (ref 22–32)
CREATININE: 0.68 mg/dL (ref 0.61–1.24)
Calcium: 8.2 mg/dL — ABNORMAL LOW (ref 8.9–10.3)
GFR calc non Af Amer: >60 mL/min (ref >60)
Glucose, Bld: 119 mg/dL — ABNORMAL HIGH (ref 70–99)
Potassium: 3.2 mmol/L — ABNORMAL LOW (ref 3.5–5.1)
Sodium: 140 mmol/L (ref 135–145)

## 2018-02-03 LAB — CULTURE, BLOOD (ROUTINE X 2): SPECIAL REQUESTS: ADEQUATE

## 2018-02-03 LAB — MAGNESIUM: Magnesium: 2 mg/dL (ref 1.7–2.4)

## 2018-02-03 MED ORDER — TRAZODONE HCL 100 MG PO TABS
100.0000 mg | ORAL_TABLET | Freq: Every evening | ORAL | Status: DC | PRN
  Administered 2018-02-03 – 2018-02-07 (×5): 100 mg via ORAL
  Filled 2018-02-03 (×5): qty 1

## 2018-02-03 MED ORDER — VANCOMYCIN HCL 10 G IV SOLR
1250.0000 mg | Freq: Three times a day (TID) | INTRAVENOUS | Status: DC
  Administered 2018-02-03 – 2018-02-06 (×8): 1250 mg via INTRAVENOUS
  Filled 2018-02-03 (×9): qty 1250

## 2018-02-03 MED ORDER — POTASSIUM CHLORIDE CRYS ER 20 MEQ PO TBCR
40.0000 meq | EXTENDED_RELEASE_TABLET | ORAL | Status: AC
  Administered 2018-02-03 (×2): 40 meq via ORAL
  Filled 2018-02-03 (×2): qty 2

## 2018-02-03 NOTE — Clinical Social Work Note (Signed)
Clinical Social Work Assessment  Patient Details  Name: Pedro Peterson MRN: [id number] Date of Birth: 08-02-1985  Date of referral:  02/03/18               Reason for consult:  Substance Use/ETOH Abuse                Permission sought to share information with:  Family Supports Permission granted to share information::  Yes, Verbal Permission Granted  Name::        Agency::  Outpatient Program/[facility]   Relationship::  Parents/ Siblings   Contact Information:     Housing/Transportation Living arrangements for the past 2 months:  Single Family Home Source of Information:  Patient Patient Interpreter Needed:  None Criminal Activity/Legal Involvement Pertinent to Current Situation/Hospitalization:  No - Comment as needed Significant Relationships:  Parents, Siblings, Friend Lives with:  Self Do you feel safe going back to the place where you live?  Yes Need for family participation in patient care:  No (Coment)  Care giving concerns:   Substance use.   Social Worker assessment / plan:  CSW met with patient at bedside, explain role and reason for visit- to provide Substance use resources for outpatient and residential. Patient receptive to CSW's and agreeable to discuss treatment options. Patient reports he was admitted for cellulitis of the right arm. He reports that his arm has been swollen and may have affected his heart. Patient understands his current treatment will require that he have a procedure done a [place] to determine if the infection has effected his heart.  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 Patient reports he was sober for a year and recently relapsed.Patient states he went to [facility] for residential treatment but stayed for a week and left.  Patient currently works full-time and will not be able to complete residential treatment again but is open to outpatient treatment.   Plan: Outpatient follow up/ CSW can  assist with arranging appointment time if patient need assistance.   Employment status:  [employer] information:  Self Pay (Medicaid Pending) PT Recommendations:  Not assessed at this time Information / Referral to community resources:  Outpatient Substance Abuse Treatment Options, Residential Substance Abuse Treatment Options  Patient/Family's Response to care:  No family at bedside. Patient report his family is very supportive and has been in contact with patient via phone since admission. Patient is agreeable and responding well to care.   Patient/Family's Understanding of and Emotional Response to Diagnosis, Current Treatment, and Prognosis:  Patient seemed worried about his procedure on Monday and the results. Patient is learning about his diagnosis and treatment.  Emotional Assessment Appearance:  Appears stated age Attitude/Demeanor/Rapport:    Affect (typically observed):  Accepting, Calm, Pleasant Orientation:  Oriented to Self, Oriented to Place, Oriented to  Time, Oriented to Situation Alcohol / Substance use:  Not Applicable Psych involvement (Current and /or in the community):  No (Comment)  Discharge Needs  Concerns to be addressed:  Substance Abuse Concerns Readmission within the last 30 days:  No Current discharge risk:  Substance Abuse Barriers to Discharge:  No Barriers Identified   Lia Hopping, LCSW 02/03/2018, 11:49 AM

## 2018-02-03 NOTE — Telephone Encounter (Signed)
[facility] with + BC [facility]
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
## 2018-02-03 NOTE — Progress Notes (Cosign Needed)
Patient has had a quiet day. Was placed on contact precautions due to lab results of MRSA in his blood stream. Patient was educated as to why he was being placed on precautions and that the doctor would go over it in more detail with him. Patient states that he is tolerating withdrawals well with medication. HR normal, no diaphoresis. Client states that he can get short tempered, but medication has made that much better. Patient showed himself and his IV was covered with plastic and taped to prevent IV hub from getting wet.

## 2018-02-03 NOTE — Progress Notes (Signed)
Pharmacy Antibiotic Note  Pedro Peterson is a 32 y.o. male admitted on 01/31/2018 with bacteremia.  Pharmacy has been consulted for vancomycin dosing. Pharmacy is consulted to dose vancomycin after positive BC have shown MRSA and strep pyogenes in blood. Pt was previously on ceftriaxone but once culture resulted showing MRSA was transitioned to vancomycin.   Plan:  Vancomycin 1250 mg IV q8h    Monitor clinical course, renal function, cultures as available   Height: 5\' 11"  (180.3 cm) Weight: 185 lb (83.9 kg) IBW/kg (Calculated) : 75.3  Temp (24hrs), Avg:98.2 F (36.8 C), Min:98.1 F (36.7 C), Max:98.3 F (36.8 C)  Recent Labs  Lab 01/30/18 1445 01/30/18 1448 01/31/18 1848 01/31/18 1900 01/31/18 2046 01/31/18 2053 01/31/18 2144 02/01/18 1018 02/02/18 0448 02/03/18 0400  WBC 9.7  --  11.5*  --   --   --   --  9.3 9.5 10.8*  CREATININE 0.86  --   --   --  0.86  --  0.82 0.67 0.81 0.68  LATICACIDVEN  --  1.44  --  1.97*  --  0.99  --   --   --   --     Estimated Creatinine Clearance: 138.6 mL/min (by C-G formula based on SCr of 0.68 mg/dL).    No Known Allergies  Antimicrobials this admission: 7/9 vancomycin >>7/11, 7/13 >> 7/9 Zosyn >>7/11 7/11 levofloxacin >>7/11 7/11 ceftriaxone >> 7/13   Dose adjustments this admission: ----  Microbiology results: 7/9 MRSA PCR neg 7/10 BCx2>>ngtd 7/9 BCx2>> BCID strep pyogenes, MRSA 7/11 HIV>> NR  7/11 Hep A/B/C: HCT AB positive 7/11 HCV RNA PCR: IP   Thank you for allowing pharmacy to be a part of this patient's care.  Adalberto ColeNikola Senna Lape, PharmD, BCPS Pager [phone] 02/03/2018 4:23 PM

## 2018-02-04 LAB — CBC WITH DIFFERENTIAL/PLATELET
BASOS ABS: 0 10*3/uL (ref 0.0–0.1)
Band Neutrophils: 2 %
Basophils Relative: 0 %
EOS PCT: 2 %
Eosinophils Absolute: 0.2 10*3/uL (ref 0.0–0.7)
HCT: 34.9 % — ABNORMAL LOW (ref 39.0–52.0)
HEMOGLOBIN: 12 g/dL — AB (ref 13.0–17.0)
Lymphocytes Relative: 35 %
Lymphs Abs: 3.8 10*3/uL (ref 0.7–4.0)
MCH: 30.2 pg (ref 26.0–34.0)
MCHC: 34.4 g/dL (ref 30.0–36.0)
MCV: 87.9 fL (ref 78.0–100.0)
METAMYELOCYTES PCT: 1 %
MONO ABS: 0.8 10*3/uL (ref 0.1–1.0)
MYELOCYTES: 2 %
Monocytes Relative: 7 %
Neutro Abs: 6.1 10*3/uL (ref 1.7–7.7)
Neutrophils Relative %: 51 %
PLATELETS: 403 10*3/uL — AB (ref 150–400)
RBC: 3.97 MIL/uL — AB (ref 4.22–5.81)
RDW: 14.2 % (ref 11.5–15.5)
WBC: 10.9 10*3/uL — AB (ref 4.0–10.5)

## 2018-02-04 LAB — BASIC METABOLIC PANEL
Anion gap: 5 (ref 5–15)
BUN: 8 mg/dL (ref 6–20)
CALCIUM: 8.1 mg/dL — AB (ref 8.9–10.3)
CHLORIDE: 109 mmol/L (ref 98–111)
CO2: 26 mmol/L (ref 22–32)
Creatinine, Ser: 0.59 mg/dL — ABNORMAL LOW (ref 0.61–1.24)
GFR calc non Af Amer: >60 mL/min (ref >60)
Glucose, Bld: 107 mg/dL — ABNORMAL HIGH (ref 70–99)
Potassium: 4 mmol/L (ref 3.5–5.1)
SODIUM: 140 mmol/L (ref 135–145)

## 2018-02-04 LAB — HEPATITIS C VRS RNA DETECT BY PCR-QUAL: Hepatitis C Vrs RNA by PCR-Qual: POSITIVE — AB
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
## 2018-02-04 NOTE — Progress Notes (Signed)
ANTICOAGULATION CONSULT NOTE - Follow-UpConsult  Pharmacy Consult for xarelto Indication: UE DVT  No Known Allergies  Patient Measurements: Height: 5\' 11"  (180.3 cm) Weight: 185 lb (83.9 kg) IBW/kg (Calculated) : 75.3   Vital Signs: Temp: 98 F (36.7 C) (07/14 0704) Temp Source: Oral (07/14 0704) BP: 141/92 (07/14 0704) Pulse Rate: 64 (07/14 0704)  Labs: Recent Labs    02/01/18 1018 02/02/18 0448 02/03/18 0400 02/04/18 0422  HGB 12.3* 11.6* 11.5* 12.0*  HCT 35.1* 33.5* 33.5* 34.9*  PLT 232 276 367 403*  CREATININE 0.67 0.81 0.68 0.59*  TROPONINI <0.03  --   --   --     Estimated Creatinine Clearance: 138.6 mL/min (A) (by C-G formula based on SCr of 0.59 mg/dL (L)).   Medical History: Past Medical History:  Diagnosis Date  . Drug abuse (HCC)   . IV drug user     Medications:  Scheduled:  . buprenorphine-naloxone  1 tablet Sublingual BID  . nicotine  21 mg Transdermal Daily  . pantoprazole  40 mg Oral Daily  . polyethylene glycol  17 g Oral Daily  . Rivaroxaban  15 mg Oral BID WC  . [START ON 02/22/2018] rivaroxaban  20 mg Oral Daily  . senna-docusate  1 tablet Oral BID   Infusions:  . vancomycin Stopped (02/04/18 0430)    Assessment: 32 yoM with hx of IV drug abuse admitted with swelling and erythema of right UE.  Patient is + for occlusive DVT involving the central aspect of one of the paired brachial veins as well as the radial vein.  Xarelto per Rx for UE DVT  Pt completed course of scheduled ibuprofen without noted bleeding. Pt also still has a PRN naproxen order active  Today, 02/04/18   Hgb 12, plt 403, stable  Scr 0.59 stable, Crcl > 100 ml/min  No bleeding issues charted or noted     Plan:   Xarelto 15 mg bid  21 days   Then Xarelto 20 mg daily starting on 8/1  Patient provided education along with manufacturer coupon and book   Monitor for signs and symptoms of bleeding  Monitor renal function closely as pt has also been started  on vancomycin  Pharmacy will sign off from writing note for Xarelto as anticipate stable dose but will follow along peripherally     Adalberto ColeNikola Saburo Luger, PharmD, BCPS Pager [phone] 02/04/2018 9:04 AM

## 2018-02-05 ENCOUNTER — Encounter (HOSPITAL_COMMUNITY)
Admission: EM | Disposition: A | Discharge status: Home / Self Care | Payer: Self-pay | Source: Home / Self Care | Attending: Internal Medicine

## 2018-02-05 ENCOUNTER — Inpatient Hospital Stay (HOSPITAL_COMMUNITY): Payer: Self-pay

## 2018-02-05 ENCOUNTER — Inpatient Hospital Stay (HOSPITAL_COMMUNITY): Payer: Self-pay | Admitting: Certified Registered Nurse Anesthetist

## 2018-02-05 ENCOUNTER — Encounter (HOSPITAL_COMMUNITY): Payer: Self-pay | Admitting: Cardiovascular Disease

## 2018-02-05 DIAGNOSIS — R7881 Bacteremia: Secondary | ICD-10-CM

## 2018-02-05 HISTORY — PX: TEE WITHOUT CARDIOVERSION: SHX5443

## 2018-02-05 LAB — CBC WITH DIFFERENTIAL/PLATELET
Basophils Absolute: 0 10*3/uL (ref 0.0–0.1)
Basophils Relative: 0 %
EOS ABS: 0.1 10*3/uL (ref 0.0–0.7)
EOS PCT: 1 %
HCT: 36.6 % — ABNORMAL LOW (ref 39.0–52.0)
HEMOGLOBIN: 12.2 g/dL — AB (ref 13.0–17.0)
Lymphocytes Relative: 31 %
Lymphs Abs: 2.9 10*3/uL (ref 0.7–4.0)
MCH: 29.6 pg (ref 26.0–34.0)
MCHC: 33.3 g/dL (ref 30.0–36.0)
MCV: 88.8 fL (ref 78.0–100.0)
MONO ABS: 0.8 10*3/uL (ref 0.1–1.0)
MONOS PCT: 9 %
NEUTROS ABS: 5.4 10*3/uL (ref 1.7–7.7)
Neutrophils Relative %: 59 %
PLATELETS: 434 10*3/uL — AB (ref 150–400)
RBC: 4.12 MIL/uL — ABNORMAL LOW (ref 4.22–5.81)
RDW: 14.1 % (ref 11.5–15.5)
WBC: 9.2 10*3/uL (ref 4.0–10.5)

## 2018-02-05 LAB — BASIC METABOLIC PANEL
ANION GAP: 7 (ref 5–15)
BUN: 7 mg/dL (ref 6–20)
CALCIUM: 8.5 mg/dL — AB (ref 8.9–10.3)
CO2: 27 mmol/L (ref 22–32)
Chloride: 110 mmol/L (ref 98–111)
Creatinine, Ser: 0.6 mg/dL — ABNORMAL LOW (ref 0.61–1.24)
GFR calc Af Amer: >60 mL/min (ref >60)
GLUCOSE: 99 mg/dL (ref 70–99)
Potassium: 4.2 mmol/L (ref 3.5–5.1)
SODIUM: 144 mmol/L (ref 135–145)

## 2018-02-05 LAB — CULTURE, BLOOD (ROUTINE X 2)
CULTURE: NO GROWTH
Culture: NO GROWTH
Special Requests: ADEQUATE

## 2018-02-05 LAB — MRSA PCR SCREENING: MRSA by PCR: NEGATIVE

## 2018-02-05 SURGERY — ECHOCARDIOGRAM, TRANSESOPHAGEAL
Anesthesia: Monitor Anesthesia Care

## 2018-02-05 MED ORDER — PROPOFOL 10 MG/ML IV BOLUS
INTRAVENOUS | Status: DC | PRN
  Administered 2018-02-05: 30 mg via INTRAVENOUS
  Administered 2018-02-05: 100 mg via INTRAVENOUS
  Administered 2018-02-05 (×2): 50 mg via INTRAVENOUS

## 2018-02-05 MED ORDER — SODIUM CHLORIDE 0.9 % IV SOLN
INTRAVENOUS | Status: DC | PRN
  Administered 2018-02-05: 11:00:00 via INTRAVENOUS

## 2018-02-05 MED ORDER — HEPATITIS A VACCINE 1440 EL U/ML IM SUSP
1.0000 mL | Freq: Once | INTRAMUSCULAR | Status: DC
  Filled 2018-02-05: qty 1

## 2018-02-05 MED ORDER — BUTAMBEN-TETRACAINE-BENZOCAINE 2-2-14 % EX AERO
INHALATION_SPRAY | CUTANEOUS | Status: DC | PRN
  Administered 2018-02-05: 2 via TOPICAL

## 2018-02-05 MED ORDER — PROPOFOL 500 MG/50ML IV EMUL
INTRAVENOUS | Status: DC | PRN
  Administered 2018-02-05: 200 ug/kg/min via INTRAVENOUS

## 2018-02-05 NOTE — H&P (Signed)
Pedro Peterson is a 32 y.o. male who has presented today for surgery, with the diagnosis of bacteremia. The various methods of treatment have been discussed with the patient and family. After consideration of risks, benefits and other options for treatment, the patient has consented to Procedure(s): TRANSESOPHAGEAL ECHOCARDIOGRAM (TEE) (N/A) as a surgical intervention . The patient's history has been reviewed, patient examined, no change in status, stable for surgery. I have reviewed the patient's chart and labs. Questions were answered to the patient's satisfaction.   Pedro Winther C. Duke Salviaandolph, MD, [facility]  02/05/2018 11:07 AM

## 2018-02-05 NOTE — Transfer of Care (Signed)
Immediate Anesthesia Transfer of Care Note  Patient: Pedro Peterson  Procedure(s) Performed: TRANSESOPHAGEAL ECHOCARDIOGRAM (TEE) (N/A )  Patient Location: Endoscopy Unit  Anesthesia Type:MAC  Level of Consciousness: awake, alert , oriented and patient cooperative  Airway & Oxygen Therapy: Patient Spontanous Breathing and Patient connected to nasal cannula oxygen  Post-op Assessment: Report given to RN, Post -op Vital signs reviewed and stable and Patient moving all extremities X 4  Post vital signs: Reviewed and stable  Last Vitals:  Vitals Value Taken Time  BP 128/77 02/05/2018 11:32 AM  Temp    Pulse 85 02/05/2018 11:33 AM  Resp 16 02/05/2018 11:33 AM  SpO2 96 % 02/05/2018 11:33 AM  Vitals shown include unvalidated device data.  Last Pain:  Vitals:   02/05/18 1026  TempSrc: Oral  PainSc: 0-No pain      Patients Stated Pain Goal: 2 (50/35/46 5681)  Complications: No apparent anesthesia complications

## 2018-02-05 NOTE — Progress Notes (Addendum)
INFECTIOUS DISEASE PROGRESS NOTE  ID: Pedro Peterson is a 32 y.o. male with  Principal Problem:   Bacteremia due to Streptococcus: Group A Active Problems:   Hypokalemia   Cellulitis of right upper extremity   Cellulitis   Acute deep vein thrombosis (DVT) of right upper extremity (HCC)   IV drug abuse (HCC)   Community acquired pneumonia   Nonspecific chest pain   Septic phlebitis of upper extremity   Hepatitis C antibody test positive   MRSA bacteremia  Subjective: Feels better on suboxonne Worried about palpable clot in his RUE.   Abtx:  Anti-infectives (From admission, onward)   Start     Dose/Rate Route Frequency Ordered Stop   02/03/18 1800  vancomycin (VANCOCIN) 1,250 mg in sodium chloride 0.9 % 250 mL IVPB     1,250 mg 166.7 mL/hr over 90 Minutes Intravenous Every 8 hours 02/03/18 1623     02/02/18 1000  azithromycin (ZITHROMAX) tablet 500 mg  Status:  Discontinued     500 mg Oral Daily 02/01/18 1019 02/01/18 1350   02/01/18 1200  cefTRIAXone (ROCEPHIN) 2 g in sodium chloride 0.9 % 100 mL IVPB  Status:  Discontinued     2 g 200 mL/hr over 30 Minutes Intravenous Every 24 hours 02/01/18 1018 02/03/18 1549   02/01/18 0900  vancomycin (VANCOCIN) 1,250 mg in sodium chloride 0.9 % 250 mL IVPB  Status:  Discontinued     1,250 mg 166.7 mL/hr over 90 Minutes Intravenous Every 12 hours 01/31/18 2212 02/01/18 1018   02/01/18 0600  piperacillin-tazobactam (ZOSYN) IVPB 3.375 g  Status:  Discontinued     3.375 g 12.5 mL/hr over 240 Minutes Intravenous Every 8 hours 01/31/18 2212 02/01/18 1018   02/01/18 0215  levofloxacin (LEVAQUIN) IVPB 750 mg  Status:  Discontinued     750 mg 100 mL/hr over 90 Minutes Intravenous Every 24 hours 02/01/18 0200 02/01/18 1018   01/31/18 1845  vancomycin (VANCOCIN) 1,500 mg in sodium chloride 0.9 % 500 mL IVPB     1,500 mg 250 mL/hr over 120 Minutes Intravenous  Once 01/31/18 1837 01/31/18 2250   01/31/18 1845  piperacillin-tazobactam (ZOSYN) IVPB  3.375 g     3.375 g 100 mL/hr over 30 Minutes Intravenous  Once 01/31/18 1837 01/31/18 2001      Medications:  Scheduled: . buprenorphine-naloxone  1 tablet Sublingual BID  . nicotine  21 mg Transdermal Daily  . pantoprazole  40 mg Oral Daily  . polyethylene glycol  17 g Oral Daily  . Rivaroxaban  15 mg Oral BID WC  . [START ON 02/22/2018] rivaroxaban  20 mg Oral Daily  . senna-docusate  1 tablet Oral BID    Objective: Vital signs in last 24 hours: Temp:  [98.3 F (36.8 C)-98.4 F (36.9 C)] 98.4 F (36.9 C) (07/15 1130) Pulse Rate:  [64-76] 68 (07/15 1317) Resp:  [12-20] 12 (07/15 1150) BP: (128-157)/(66-97) 137/89 (07/15 1317) SpO2:  [92 %-98 %] 98 % (07/15 1317)   General appearance: alert, cooperative and no distress Resp: clear to auscultation bilaterally Cardio: regular rate and rhythm GI: normal findings: bowel sounds normal and soft, non-tender Extremities: palpable cordis RUE. non-tender.   Lab Results Recent Labs    02/04/18 0422 02/05/18 0443  WBC 10.9* 9.2  HGB 12.0* 12.2*  HCT 34.9* 36.6*  NA 140 144  K 4.0 4.2  CL 109 110  CO2 26 27  BUN 8 7  CREATININE 0.59* 0.60*   Liver Panel No  results for input(s): PROT, ALBUMIN, AST, ALT, ALKPHOS, BILITOT, BILIDIR, IBILI in the last 72 hours. Sedimentation Rate No results for input(s): ESRSEDRATE in the last 72 hours. C-Reactive Protein No results for input(s): CRP in the last 72 hours.  Microbiology: Recent Results (from the past 240 hour(s))  Blood culture (routine x 2)     Status: Abnormal   Collection Time: 01/30/18  2:30 PM  Result Value Ref Range Status   Specimen Description   Final    BLOOD BLOOD LEFT FOREARM Performed at [facility] [place], [address] [place], [place] [address]    Special Requests   Final    BOTTLES DRAWN AEROBIC AND ANAEROBIC Blood Culture results may not be optimal due to an inadequate volume of blood received in culture bottles Performed at [facility] [place], [address] [place], [place] [address]    Culture  Setup Time   Final    GRAM POSITIVE COCCI IN CHAINS IN BOTH AEROBIC AND ANAEROBIC BOTTLES CRITICAL VALUE NOTED.  VALUE IS CONSISTENT WITH PREVIOUSLY REPORTED AND CALLED VALUE. Performed at [facility], [address] [place], [place] [address]    Culture GROUP A STREP (S.PYOGENES) ISOLATED (A)  Final   Report Status 02/02/2018 FINAL  Final   Organism ID, Bacteria GROUP A STREP (S.PYOGENES) ISOLATED  Final      Susceptibility   Group a strep (s.pyogenes) isolated - MIC*    PENICILLIN <=0.06 SENSITIVE Sensitive     CEFTRIAXONE <=0.12 SENSITIVE Sensitive     ERYTHROMYCIN >=8 RESISTANT Resistant     LEVOFLOXACIN 0.5 SENSITIVE Sensitive     VANCOMYCIN <=0.12 SENSITIVE Sensitive     * GROUP A STREP (S.PYOGENES) ISOLATED  Blood culture (routine x 2)     Status: Abnormal   Collection Time: 01/30/18  2:44 PM  Result Value Ref Range Status   Specimen Description   Final    BLOOD UPPER BLOOD LEFT ARM Performed at [facility] [place], [address] [place], [place] [address]    Special Requests   Final    BOTTLES DRAWN AEROBIC AND ANAEROBIC Blood Culture adequate volume Performed at [facility] [place], [address] [place], [place] [address]    Culture  Setup Time   Final    IN BOTH AEROBIC AND ANAEROBIC BOTTLES GRAM POSITIVE COCCI IN CHAINS CRITICAL RESULT CALLED TO, READ BACK BY AND VERIFIED WITH: RN D MERRITT [phone] MLM Performed at [facility], [address] [place], [place] [address]    Culture (A)  Final    GROUP A STREP (S.PYOGENES) ISOLATED SUSCEPTIBILITIES PERFORMED ON PREVIOUS CULTURE WITHIN THE LAST 5 DAYS. METHICILLIN RESISTANT STAPHYLOCOCCUS AUREUS    Report Status 02/03/2018 FINAL  Final   Organism ID, Bacteria METHICILLIN RESISTANT STAPHYLOCOCCUS AUREUS  Final      Susceptibility   Methicillin resistant staphylococcus aureus - MIC*    CIPROFLOXACIN <=0.5 SENSITIVE  Sensitive     ERYTHROMYCIN >=8 RESISTANT Resistant     GENTAMICIN <=0.5 SENSITIVE Sensitive     OXACILLIN >=4 RESISTANT Resistant     TETRACYCLINE <=1 SENSITIVE Sensitive     VANCOMYCIN <=0.5 SENSITIVE Sensitive     TRIMETH/SULFA <=10 SENSITIVE Sensitive     CLINDAMYCIN <=0.25 SENSITIVE Sensitive     RIFAMPIN <=0.5 SENSITIVE Sensitive     Inducible Clindamycin NEGATIVE Sensitive     * METHICILLIN RESISTANT STAPHYLOCOCCUS AUREUS  Blood Culture ID Panel (Reflexed)  Status: Abnormal   Collection Time: 01/30/18  2:44 PM  Result Value Ref Range Status   Enterococcus species NOT DETECTED NOT DETECTED Final   Listeria monocytogenes NOT DETECTED NOT DETECTED Final   Staphylococcus species NOT DETECTED NOT DETECTED Final   Staphylococcus aureus NOT DETECTED NOT DETECTED Final   Streptococcus species DETECTED (A) NOT DETECTED Final    Comment: CRITICAL RESULT CALLED TO, READ BACK BY AND VERIFIED WITH: RN D MERRITT [phone] MLM    Streptococcus agalactiae NOT DETECTED NOT DETECTED Final   Streptococcus pneumoniae NOT DETECTED NOT DETECTED Final   Streptococcus pyogenes DETECTED (A) NOT DETECTED Final    Comment: CRITICAL RESULT CALLED TO, READ BACK BY AND VERIFIED WITH: RN D MERRITT [phone] MLM    Acinetobacter baumannii NOT DETECTED NOT DETECTED Final   Enterobacteriaceae species NOT DETECTED NOT DETECTED Final   Enterobacter cloacae complex NOT DETECTED NOT DETECTED Final   Escherichia coli NOT DETECTED NOT DETECTED Final   Klebsiella oxytoca NOT DETECTED NOT DETECTED Final   Klebsiella pneumoniae NOT DETECTED NOT DETECTED Final   Proteus species NOT DETECTED NOT DETECTED Final   Serratia marcescens NOT DETECTED NOT DETECTED Final   Haemophilus influenzae NOT DETECTED NOT DETECTED Final   Neisseria meningitidis NOT DETECTED NOT DETECTED Final   Pseudomonas aeruginosa NOT DETECTED NOT DETECTED Final   Candida albicans NOT DETECTED NOT DETECTED Final   Candida glabrata NOT  DETECTED NOT DETECTED Final   Candida krusei NOT DETECTED NOT DETECTED Final   Candida parapsilosis NOT DETECTED NOT DETECTED Final   Candida tropicalis NOT DETECTED NOT DETECTED Final    Comment: Performed at [facility], [address], [place], [place] [address]  Culture, blood (routine x 2)     Status: None   Collection Time: 01/31/18  6:51 PM  Result Value Ref Range Status   Specimen Description   Final    BLOOD LEFT FOREARM Performed at [facility], [address], [place], [place] [address]    Special Requests   Final    BOTTLES DRAWN AEROBIC AND ANAEROBIC Blood Culture adequate volume Performed at [facility], [address] [place], [place] [address]    Culture   Final    NO GROWTH 5 DAYS Performed at [facility], [address], [place], [place] [address]    Report Status 02/05/2018 FINAL  Final  Culture, blood (routine x 2)     Status: None   Collection Time: 01/31/18  7:18 PM  Result Value Ref Range Status   Specimen Description   Final    BLOOD LEFT WRIST Performed at [facility], [address], [place], [place] [address]    Special Requests   Final    BOTTLES DRAWN AEROBIC AND ANAEROBIC Blood Culture results may not be optimal due to an inadequate volume of blood received in culture bottles Performed at [facility], [address], [place], [place] [address]    Culture   Final    NO GROWTH 5 DAYS Performed at [facility], [address], [place], [place] [address]    Report Status 02/05/2018 FINAL  Final    Studies/Results: No results found.   Assessment/Plan: Bacteremia, strep and MRSA IVDA             Currently on suboxone Septic phlebitis Hep C Ab and RNA+  Total days of antibiotics: 6 ceftriaxone, 2 vanco  TEE (-) for IE Repeat BCx 7-10 are negative His arm is improving.  After 5 days of vanco, could consider home with keflex and  bactrim to complete 2 weeks of therapy.  Will repeat his Hep C studies, will need to be clean, genotype, viral load for treatment. Can be seen in our clinic for tx.  Will need suboxonne f/u.  He needs to start Hep A series.  Available as needed.           Johny Sax MD, FACP Infectious Diseases (pager) [phone] [email or web address] 02/05/2018, 2:45 PM  LOS: 5 days

## 2018-02-05 NOTE — Progress Notes (Signed)
Pharmacy Antibiotic Note  Pedro Peterson is a 32 y.o. male admitted on 01/31/2018 with bacteremia.  Pharmacy has been consulted for vancomycin dosing. Pharmacy is consulted to dose vancomycin after positive BC have shown MRSA and strep pyogenes in blood. Pt was previously on ceftriaxone but once culture resulted showing MRSA was transitioned to vancomycin.   Plan:  Vancomycin 1250 mg IV q8h    Monitor clinical course, renal function, cultures as available  Peak and trough levels tomorrow 7/16   Height: 5\' 11"  (180.3 cm) Weight: 185 lb (83.9 kg) IBW/kg (Calculated) : 75.3  Temp (24hrs), Avg:98.4 F (36.9 C), Min:98.3 F (36.8 C), Max:98.4 F (36.9 C)  Recent Labs  Lab 01/30/18 1448  01/31/18 1900  01/31/18 2053  02/01/18 1018 02/02/18 0448 02/03/18 0400 02/04/18 0422 02/05/18 0443  WBC  --    < >  --   --   --   --  9.3 9.5 10.8* 10.9* 9.2  CREATININE  --   --   --    < >  --    < > 0.67 0.81 0.68 0.59* 0.60*  LATICACIDVEN 1.44  --  1.97*  --  0.99  --   --   --   --   --   --    < > = values in this interval not displayed.    Estimated Creatinine Clearance: 138.6 mL/min (A) (by C-G formula based on SCr of 0.6 mg/dL (L)).    No Known Allergies  Antimicrobials this admission: 7/9 vancomycin >>7/11, 7/13 >> 7/9 Zosyn >>7/11 7/11 levofloxacin >>7/11 7/11 ceftriaxone >> 7/13   Dose adjustments this admission: 7/16: Peak after 0200 dose, trough prior to 1000 dose  Microbiology results: 7/9 MRSA PCR neg 7/11 HIV: neg  7/11 Hep A/B/C: HCT AB positive 7/11 HCV RNA PCR: IP  7/15 MRSA PCR: sent  7/9 BCx2: BCID strep pyogenes ( r to erythro, MRSA ( r to erythro, oxacillin)  7/10 BCx2: ngtd   Thank you for allowing pharmacy to be a part of this patient's care.  Otho BellowsGreen, Pedro Peterson L PharmD Pager [phone] 02/05/2018, 2:19 PM

## 2018-02-05 NOTE — Anesthesia Procedure Notes (Signed)
Procedure Name: MAC Date/Time: 02/05/2018 11:08 AM Performed by: Colin Benton, CRNA Pre-anesthesia Checklist: Patient identified, Emergency Drugs available, Suction available and Patient being monitored Patient Re-evaluated:Patient Re-evaluated prior to induction Oxygen Delivery Method: Nasal cannula Induction Type: IV induction Placement Confirmation: positive ETCO2
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
## 2018-02-05 NOTE — CV Procedure (Signed)
Brief TEE Note  LVEF 55-60% Trivial MR, TR and PR No LA/LAA thrombus  No evidence of endocarditis appreciated.  For additional details see full report.  Chevi Lim C. Duke Salviaandolph, MD, [facility] 02/05/2018 11:31 AM

## 2018-02-05 NOTE — Progress Notes (Signed)
  Echocardiogram Echocardiogram Transesophageal has been performed.  Tye SavoyCasey N Chestina Komatsu 02/05/2018, 11:53 AM

## 2018-02-05 NOTE — Progress Notes (Signed)
[facility]. Maeola Harmanark, Jaquelyn Sakamoto Johnson

## 2018-02-05 NOTE — Anesthesia Preprocedure Evaluation (Signed)
Anesthesia Evaluation  Patient identified by MRN, date of birth, ID band Patient awake    Reviewed: Allergy & Precautions, NPO status , Patient's Chart, lab work & pertinent test results  History of Anesthesia Complications Negative for: history of anesthetic complications  Airway Mallampati: II  TM Distance: >3 FB Neck ROM: Full    Dental  (+) Teeth Intact   Pulmonary Current Smoker,    breath sounds clear to auscultation       Cardiovascular + Peripheral Vascular Disease   Rhythm:Regular     Neuro/Psych PSYCHIATRIC DISORDERS negative neurological ROS     GI/Hepatic negative GI ROS, Neg liver ROS,   Endo/Other  negative endocrine ROS  Renal/GU negative Renal ROS     Musculoskeletal   Abdominal   Peds  Hematology  (+) anemia ,   Anesthesia Other Findings ? Endocarditis   Reproductive/Obstetrics                             Anesthesia Physical Anesthesia Plan  ASA: II  Anesthesia Plan: MAC   Post-op Pain Management:    Induction: Intravenous  PONV Risk Score and Plan: 0 and Treatment may vary due to age or medical condition  Airway Management Planned: Nasal Cannula  Additional Equipment: None  Intra-op Plan:   Post-operative Plan:   Informed Consent: I have reviewed the patients History and Physical, chart, labs and discussed the procedure including the risks, benefits and alternatives for the proposed anesthesia with the patient or authorized representative who has indicated his/her understanding and acceptance.   Dental advisory given  Plan Discussed with: CRNA and Surgeon  Anesthesia Plan Comments:         Anesthesia Quick Evaluation

## 2018-02-06 LAB — BASIC METABOLIC PANEL
ANION GAP: 7 (ref 5–15)
BUN: 10 mg/dL (ref 6–20)
CO2: 28 mmol/L (ref 22–32)
CREATININE: 0.82 mg/dL (ref 0.61–1.24)
Calcium: 8.6 mg/dL — ABNORMAL LOW (ref 8.9–10.3)
Chloride: 106 mmol/L (ref 98–111)
GFR calc Af Amer: >60 mL/min (ref >60)
GFR calc non Af Amer: >60 mL/min (ref >60)
Glucose, Bld: 95 mg/dL (ref 70–99)
POTASSIUM: 4.1 mmol/L (ref 3.5–5.1)
Sodium: 141 mmol/L (ref 135–145)

## 2018-02-06 LAB — CBC WITH DIFFERENTIAL/PLATELET
BASOS PCT: 0 %
Basophils Absolute: 0 10*3/uL (ref 0.0–0.1)
EOS PCT: 1 %
Eosinophils Absolute: 0.1 10*3/uL (ref 0.0–0.7)
HCT: 38.6 % — ABNORMAL LOW (ref 39.0–52.0)
Hemoglobin: 12.7 g/dL — ABNORMAL LOW (ref 13.0–17.0)
Lymphocytes Relative: 29 %
Lymphs Abs: 2.7 10*3/uL (ref 0.7–4.0)
MCH: 29.8 pg (ref 26.0–34.0)
MCHC: 32.9 g/dL (ref 30.0–36.0)
MCV: 90.6 fL (ref 78.0–100.0)
MONO ABS: 0.9 10*3/uL (ref 0.1–1.0)
Monocytes Relative: 10 %
NEUTROS ABS: 5.6 10*3/uL (ref 1.7–7.7)
NEUTROS PCT: 60 %
PLATELETS: 477 10*3/uL — AB (ref 150–400)
RBC: 4.26 MIL/uL (ref 4.22–5.81)
RDW: 14.6 % (ref 11.5–15.5)
WBC: 9.3 10*3/uL (ref 4.0–10.5)

## 2018-02-06 LAB — VANCOMYCIN, PEAK: VANCOMYCIN PK: 37 ug/mL (ref 30–40)

## 2018-02-06 LAB — VANCOMYCIN, TROUGH: VANCOMYCIN TR: 23 ug/mL — AB (ref 15–20)

## 2018-02-06 LAB — MAGNESIUM: Magnesium: 2.1 mg/dL (ref 1.7–2.4)

## 2018-02-06 MED ORDER — VANCOMYCIN HCL 10 G IV SOLR
1250.0000 mg | Freq: Two times a day (BID) | INTRAVENOUS | Status: DC
  Administered 2018-02-06 – 2018-02-08 (×5): 1250 mg via INTRAVENOUS
  Filled 2018-02-06 (×5): qty 1250

## 2018-02-06 MED ORDER — IBUPROFEN 800 MG PO TABS
800.0000 mg | ORAL_TABLET | Freq: Three times a day (TID) | ORAL | Status: DC
  Administered 2018-02-06 – 2018-02-08 (×8): 800 mg via ORAL
  Filled 2018-02-06 (×7): qty 1

## 2018-02-06 NOTE — Progress Notes (Signed)
Pharmacy Antibiotic Note  Pedro MouldSean M Peterson is a 32 y.o. male admitted on 01/31/2018 with bacteremia.  Pharmacy has been consulted for vancomycin dosing. Pharmacy is consulted to dose vancomycin after positive BC have shown MRSA and strep pyogenes in blood. Pt was previously on ceftriaxone but once culture resulted showing MRSA was transitioned to vancomycin.   Plan:  Vancomycin peak level 37, trough level 23 on 1250mg  q8h  Trough level elevated  Reduce Vancomycin to 1250mg  IV q12 (AUC 491)  Height: 5\' 11"  (180.3 cm) Weight: 185 lb (83.9 kg) IBW/kg (Calculated) : 75.3  Temp (24hrs), Avg:98.6 F (37 C), Min:98.4 F (36.9 C), Max:98.8 F (37.1 C)  Recent Labs  Lab 01/30/18 1448  01/31/18 1900  01/31/18 2053  02/02/18 0448 02/03/18 0400 02/04/18 0422 02/05/18 0443 02/06/18 0435 02/06/18 0939  WBC  --    < >  --   --   --    < > 9.5 10.8* 10.9* 9.2  --  9.3  CREATININE  --   --   --    < >  --    < > 0.81 0.68 0.59* 0.60*  --  0.82  LATICACIDVEN 1.44  --  1.97*  --  0.99  --   --   --   --   --   --   --   VANCOTROUGH  --   --   --   --   --   --   --   --   --   --   --  23*  VANCOPEAK  --   --   --   --   --   --   --   --   --   --  37  --    < > = values in this interval not displayed.    Estimated Creatinine Clearance: 135.2 mL/min (by C-G formula based on SCr of 0.82 mg/dL).    No Known Allergies  Antimicrobials this admission: 7/9 vancomycin >>7/11, 7/13 >> 7/9 Zosyn >>7/11 7/11 levofloxacin >>7/11 7/11 ceftriaxone >> 7/13  Dose adjustments this admission: Vancomycin 1250 mg IV q8 (AUC 493, wt 83.9 kg, SCr 0.68) 7/16 peak 37, trough 23 >> 1250mg  q12 (AUC 491)  Microbiology results: 7/9 MRSA PCR neg 7/11 HIV: neg  7/11 Hep A/B/C: HCT AB positive 7/12 HCV RNA PCR: positive  7/15 MRSA PCR: neg 7/15 HCV RNA quant: in process  7/9 BCx2: BCID strep pyogenes ( r to erythro, MRSA ( r to erythro, oxacillin)  7/10 BCx2: ngtd  Thank you for allowing pharmacy to be a  part of this patient's care.  Otho BellowsGreen, Alexius Ellington L PharmD Pager [phone] 02/06/2018, 11:57 AM
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
## 2018-02-07 LAB — BASIC METABOLIC PANEL
Anion gap: 9 (ref 5–15)
BUN: 12 mg/dL (ref 6–20)
CO2: 27 mmol/L (ref 22–32)
Calcium: 8.9 mg/dL (ref 8.9–10.3)
Chloride: 107 mmol/L (ref 98–111)
Creatinine, Ser: 0.84 mg/dL (ref 0.61–1.24)
GFR calc Af Amer: >60 mL/min (ref >60)
Glucose, Bld: 99 mg/dL (ref 70–99)
Potassium: 4.2 mmol/L (ref 3.5–5.1)
Sodium: 143 mmol/L (ref 135–145)

## 2018-02-07 LAB — CBC
HCT: 37.2 % — ABNORMAL LOW (ref 39.0–52.0)
Hemoglobin: 12.4 g/dL — ABNORMAL LOW (ref 13.0–17.0)
MCH: 29.8 pg (ref 26.0–34.0)
MCHC: 33.3 g/dL (ref 30.0–36.0)
MCV: 89.4 fL (ref 78.0–100.0)
PLATELETS: 430 10*3/uL — AB (ref 150–400)
RBC: 4.16 MIL/uL — ABNORMAL LOW (ref 4.22–5.81)
RDW: 14.3 % (ref 11.5–15.5)
WBC: 7 10*3/uL (ref 4.0–10.5)

## 2018-02-07 LAB — HEPATITIS C VRS RNA DETECT BY PCR-QUAL: Hepatitis C Vrs RNA by PCR-Qual: POSITIVE — AB

## 2018-02-07 MED ORDER — BUPRENORPHINE HCL-NALOXONE HCL 8-2 MG SL SUBL
1.0000 | SUBLINGUAL_TABLET | Freq: Every day | SUBLINGUAL | Status: DC
  Administered 2018-02-08: 1 via SUBLINGUAL
  Filled 2018-02-07: qty 1

## 2018-02-07 NOTE — Progress Notes (Signed)
Spoke with pt concerning Xarelto, medications and hospital full up appointment. Pharmacy gave pt a 30 days free card for Xarelto. Pt states that he will be able to purchase his ABX's. Encourage pt to use [employer] app on his cell. Telephone numbers given to pt to make hospital follow with [facility] & [facility].
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
## 2018-02-08 DIAGNOSIS — B955 Unspecified streptococcus as the cause of diseases classified elsewhere: Secondary | ICD-10-CM

## 2018-02-08 LAB — CBC
HCT: 37.1 % — ABNORMAL LOW (ref 39.0–52.0)
HEMOGLOBIN: 12.1 g/dL — AB (ref 13.0–17.0)
MCH: 29.7 pg (ref 26.0–34.0)
MCHC: 32.6 g/dL (ref 30.0–36.0)
MCV: 90.9 fL (ref 78.0–100.0)
PLATELETS: 438 10*3/uL — AB (ref 150–400)
RBC: 4.08 MIL/uL — AB (ref 4.22–5.81)
RDW: 14.3 % (ref 11.5–15.5)
WBC: 5.6 10*3/uL (ref 4.0–10.5)

## 2018-02-08 LAB — BASIC METABOLIC PANEL
ANION GAP: 7 (ref 5–15)
BUN: 12 mg/dL (ref 6–20)
CO2: 25 mmol/L (ref 22–32)
CREATININE: 0.76 mg/dL (ref 0.61–1.24)
Calcium: 8.6 mg/dL — ABNORMAL LOW (ref 8.9–10.3)
Chloride: 107 mmol/L (ref 98–111)
GFR calc non Af Amer: >60 mL/min (ref >60)
Glucose, Bld: 85 mg/dL (ref 70–99)
Potassium: 4 mmol/L (ref 3.5–5.1)
SODIUM: 139 mmol/L (ref 135–145)

## 2018-02-08 MED ORDER — BUPRENORPHINE HCL-NALOXONE HCL 8-2 MG SL SUBL: 1.0000 | SUBLINGUAL_TABLET | Freq: Every day | SUBLINGUAL | 0 refills | Status: AC

## 2018-02-08 MED ORDER — IBUPROFEN 800 MG PO TABS: 800.0000 mg | ORAL_TABLET | Freq: Three times a day (TID) | ORAL | 0 refills | Status: AC

## 2018-02-08 MED ORDER — SENNOSIDES-DOCUSATE SODIUM 8.6-50 MG PO TABS: 1.0000 | ORAL_TABLET | Freq: Two times a day (BID) | ORAL | 0 refills | Status: AC

## 2018-02-08 MED ORDER — POLYETHYLENE GLYCOL 3350 17 G PO PACK: 17.0000 g | PACK | Freq: Every day | ORAL | 0 refills | Status: AC

## 2018-02-08 MED ORDER — NICOTINE 21 MG/24HR TD PT24: 21.0000 mg | MEDICATED_PATCH | Freq: Every day | TRANSDERMAL | 0 refills | Status: AC

## 2018-02-08 MED ORDER — RIVAROXABAN 15 MG PO TABS: 15.0000 mg | ORAL_TABLET | Freq: Two times a day (BID) | ORAL | 0 refills | Status: AC

## 2018-02-08 MED ORDER — BUPRENORPHINE HCL-NALOXONE HCL 8-2 MG SL SUBL: 1.0000 | SUBLINGUAL_TABLET | Freq: Every day | SUBLINGUAL | 0 refills | Status: DC

## 2018-02-08 MED ORDER — SULFAMETHOXAZOLE-TRIMETHOPRIM 800-160 MG PO TABS: 1.0000 | ORAL_TABLET | Freq: Two times a day (BID) | ORAL | 0 refills | Status: AC

## 2018-02-08 MED ORDER — TRAZODONE HCL 100 MG PO TABS: 100.0000 mg | ORAL_TABLET | Freq: Every evening | ORAL | 0 refills | Status: AC | PRN

## 2018-02-08 MED ORDER — PANTOPRAZOLE SODIUM 40 MG PO TBEC: 40.0000 mg | DELAYED_RELEASE_TABLET | Freq: Every day | ORAL | 0 refills | Status: AC

## 2018-02-08 MED ORDER — RIVAROXABAN 20 MG PO TABS: 20.0000 mg | ORAL_TABLET | Freq: Every day | ORAL | 2 refills | Status: AC

## 2018-02-08 MED ORDER — CEPHALEXIN 500 MG PO CAPS: 500.0000 mg | ORAL_CAPSULE | Freq: Four times a day (QID) | ORAL | 0 refills | Status: AC

## 2018-02-08 NOTE — Progress Notes (Signed)
D/c ing to home. Voices no c/o ;pain rt arm minimal edema. Iv removed and awaiting ride. All d/c instructions given w/ verbal understanding.

## 2018-02-08 NOTE — Discharge Summary (Signed)
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 at [facility], [address] [place], [place] [address]      Labs: Basic Metabolic Panel: Recent Labs  Lab 02/02/18 0448 02/03/18 0400 02/04/18 0422 02/05/18 0443 02/06/18 0939 02/07/18 0814 02/08/18 0747  NA 142 140 140 144 141 143 139  K 3.1* 3.2* 4.0 4.2 4.1 4.2 4.0  CL 109 107 109 110 106 107 107  CO2 26 26 26 27 28 27 25   GLUCOSE 111* 119* 107* 99 95 99 85  BUN 7 6 8 7 10 12 12   CREATININE 0.81 0.68 0.59* 0.60* 0.82 0.84 0.76  CALCIUM 8.2* 8.2* 8.1* 8.5* 8.6* 8.9 8.6*  MG 1.8 2.0  --   --  2.1  --   --    Liver Function Tests: Recent Labs  Lab 02/06/18 1640  ALT BLOOD   No results for input(s): LIPASE, AMYLASE in the last 168 hours. No results for input(s): AMMONIA in the last 168  hours. CBC: Recent Labs  Lab 02/02/18 0448 02/03/18 0400 02/04/18 0422 02/05/18 0443 02/06/18 0939 02/07/18 0814 02/08/18 0747  WBC 9.5 10.8* 10.9* 9.2 9.3 7.0 5.6  NEUTROABS 6.1 6.6 6.1 5.4 5.6  --   --   HGB 11.6* 11.5* 12.0* 12.2* 12.7* 12.4* 12.1*  HCT 33.5* 33.5* 34.9* 36.6* 38.6* 37.2* 37.1*  MCV 85.9 87.0 87.9 88.8 90.6 89.4 90.9  PLT 276 367 403* 434* 477* 430* 438*   Cardiac Enzymes: No results for input(s): CKTOTAL, CKMB, CKMBINDEX, TROPONINI in the last 168 hours. BNP: BNP (last 3 results) No results for input(s): BNP in the last 8760 hours.  ProBNP (last 3 results) No results for input(s): PROBNP in the last 8760 hours.  CBG: No results for input(s): GLUCAP in the last 168 hours.     Signed:  Rhetta Mura MD   [facility] 02/08/2018, 3:54 PM

## 2018-02-08 NOTE — Care Management Note (Signed)
Case Management Note  Patient Details  Name: Pedro Peterson MRN: [id number] Date of Birth: [birth year]  Subjective/Objective:  Pt admitted with Bacteremia due to Streptococcus                   Action/Plan:Pt is from home.   Expected Discharge Date:  02/08/18               Expected Discharge Plan:  Home/Self Care  In-House Referral:  Clinical Social Work  Discharge planning Services  CM Consult  Post Acute Care Choice:    Choice offered to:     DME Arranged:    DME Agency:     HH Arranged:    HH Agency:     Status of Service:  In process, will continue to follow  If discussed at Long Length of Stay Meetings, dates discussed:    Additional CommentsGeni Bers:  Jann Milkovich, RN 02/08/2018, 3:58 PM

## 2018-02-08 NOTE — Anesthesia Postprocedure Evaluation (Signed)
Anesthesia Post Note  Patient: Pedro Peterson  Procedure(s) Performed: TRANSESOPHAGEAL ECHOCARDIOGRAM (TEE) (N/A )     Patient location during evaluation: Endoscopy Anesthesia Type: MAC Level of consciousness: awake and alert Pain management: pain level controlled Vital Signs Assessment: post-procedure vital signs reviewed and stable Respiratory status: spontaneous breathing, nonlabored ventilation, respiratory function stable and patient connected to nasal cannula oxygen Cardiovascular status: stable and blood pressure returned to baseline Postop Assessment: no apparent nausea or vomiting Anesthetic complications: no    Last Vitals:  Vitals:   02/07/18 2204 02/08/18 0613  BP: 135/81 (!) 140/91  Pulse: 75 70  Resp: 20 18  Temp: 37.1 C 36.7 C  SpO2: 99% 99%    Last Pain:  Vitals:   02/08/18 0702  TempSrc:   PainSc: 4                  Eunice Winecoff

## 2018-02-08 NOTE — Progress Notes (Signed)
Scripts printed given and pt d/c'ed ambulating per request.

## 2018-02-08 NOTE — Progress Notes (Signed)
Awaiting ride and wanted scripts printed instead of transmitted. Dr Mahala MenghiniSamtani notified.

## 2018-02-09 LAB — HCV RNA QUANT RFLX ULTRA OR GENOTYP
HCV RNA Qnt(log copy/mL): 6.461 log10 IU/mL
HEPATITIS C QUANTITATION: 2890000 [IU]/mL

## 2018-02-09 LAB — HEPATITIS C GENOTYPE

## 2018-02-27 ENCOUNTER — Telehealth: Payer: Self-pay | Admitting: *Deleted

## 2018-02-27 NOTE — Telephone Encounter (Signed)
Called pt to schedule appt for oud, lm for rtc

## 2019-03-04 ENCOUNTER — Encounter: Payer: Self-pay | Admitting: Emergency Medicine

## 2019-03-04 ENCOUNTER — Emergency Department
Admission: EM | Admit: 2019-03-04 | Discharge: 2019-03-04 | Disposition: A | Discharge status: Home / Self Care | Payer: Medicaid Other | Attending: Emergency Medicine | Admitting: Emergency Medicine

## 2019-03-04 ENCOUNTER — Other Ambulatory Visit: Payer: Self-pay

## 2019-03-04 DIAGNOSIS — W25XXXA Contact with sharp glass, initial encounter: Secondary | ICD-10-CM | POA: Insufficient documentation

## 2019-03-04 DIAGNOSIS — Y999 Unspecified external cause status: Secondary | ICD-10-CM | POA: Insufficient documentation

## 2019-03-04 DIAGNOSIS — Y9289 Other specified places as the place of occurrence of the external cause: Secondary | ICD-10-CM | POA: Insufficient documentation

## 2019-03-04 DIAGNOSIS — Y9389 Activity, other specified: Secondary | ICD-10-CM | POA: Insufficient documentation

## 2019-03-04 DIAGNOSIS — S51012A Laceration without foreign body of left elbow, initial encounter: Secondary | ICD-10-CM | POA: Insufficient documentation

## 2019-03-04 MED ORDER — SULFAMETHOXAZOLE-TRIMETHOPRIM 800-160 MG PO TABS: 1.0000 | ORAL_TABLET | Freq: Two times a day (BID) | ORAL | 0 refills | Status: AC

## 2019-03-04 NOTE — ED Provider Notes (Signed)
[facility] Emergency Department Provider Note  ____________________________________________  Time seen: Approximately 10:48 PM  I have reviewed the triage vital signs and the nursing notes.   HISTORY  Chief Complaint Laceration    HPI Pedro Peterson is a 33 y.o. male who presents the emergency department complaining of a laceration  to the left elbow.  Patient reports that he accidentally cut his elbow on a glass action of a door yesterday.  Patient was able control of bleeding with direct pressure.  He states that he did come in yesterday as he was "dizzy and could not make it in."  Patient reports that he does have a history of sepsis from MRSA and was concerned as the area was slightly reddened.  Patient has kept the area covered.  He thoroughly cleansed it out yesterday.  Up-to-date on immunizations.  No other complaints at this time.        Past Medical History:  Diagnosis Date  . Drug abuse (HCC)   . IV drug user     Patient Active Problem List   Diagnosis Date Noted  . Septic phlebitis of upper extremity 02/03/2018  . Hepatitis C antibody test positive 02/03/2018  . MRSA bacteremia 02/03/2018  . Bacteremia due to Streptococcus: Group A 02/01/2018  . Nonspecific chest pain   . Cellulitis 01/31/2018  . Acute deep vein thrombosis (DVT) of right upper extremity (HCC) 01/31/2018  . IV drug abuse (HCC) 01/31/2018  . Community acquired pneumonia 01/31/2018  . Cellulitis of right upper extremity 01/30/2018  . Opiate withdrawal (HCC) 01/24/2013  . Emesis 01/24/2013  . Hypokalemia 01/24/2013    Past Surgical History:  Procedure Laterality Date  . TEE WITHOUT CARDIOVERSION N/A 02/05/2018   Procedure: TRANSESOPHAGEAL ECHOCARDIOGRAM (TEE);  Surgeon: Chilton Siandolph, Tiffany, MD;  [facility];  Service: Cardiovascular;  Laterality: N/A;    Prior to Admission medications   Medication Sig Start Date End Date Taking? Authorizing Provider   buprenorphine-naloxone (SUBOXONE) 8-2 mg SUBL SL tablet Place 1 tablet under the tongue daily. 02/09/18   Rhetta MuraSamtani, Jai-Gurmukh, MD  ibuprofen (ADVIL,MOTRIN) 800 MG tablet Take 1 tablet (800 mg total) by mouth 3 (three) times daily. 02/08/18   Rhetta MuraSamtani, Jai-Gurmukh, MD  nicotine (NICODERM CQ - DOSED IN MG/24 HOURS) 21 mg/24hr patch Place 1 patch (21 mg total) onto the skin daily. 02/09/18   Rhetta MuraSamtani, Jai-Gurmukh, MD  pantoprazole (PROTONIX) 40 MG tablet Take 1 tablet (40 mg total) by mouth daily. 02/09/18   Rhetta MuraSamtani, Jai-Gurmukh, MD  polyethylene glycol (MIRALAX / GLYCOLAX) packet Take 17 g by mouth daily. 02/09/18   Rhetta MuraSamtani, Jai-Gurmukh, MD  ranitidine (ZANTAC) 150 MG tablet Take 1 tablet (150 mg total) by mouth 2 (two) times daily. 01/25/13   Osvaldo ShipperKrishnan, Gokul, MD  Rivaroxaban (XARELTO) 15 MG TABS tablet Take 1 tablet (15 mg total) by mouth 2 (two) times daily with a meal for 13 days. 02/08/18 02/21/18  Rhetta MuraSamtani, Jai-Gurmukh, MD  rivaroxaban (XARELTO) 20 MG TABS tablet Take 1 tablet (20 mg total) by mouth daily. 02/22/18   Rhetta MuraSamtani, Jai-Gurmukh, MD  senna-docusate (SENOKOT-S) 8.6-50 MG tablet Take 1 tablet by mouth 2 (two) times daily. 02/08/18   Rhetta MuraSamtani, Jai-Gurmukh, MD  sulfamethoxazole-trimethoprim (BACTRIM DS) 800-160 MG tablet Take 1 tablet by mouth 2 (two) times daily. 03/04/19   Cuthriell, Delorise RoyalsJonathan D, PA-C  traZODone (DESYREL) 100 MG tablet Take 1 tablet (100 mg total) by mouth at bedtime as needed for sleep. 02/08/18   Rhetta MuraSamtani, Jai-Gurmukh, MD    Allergies  Patient has no known allergies.  History reviewed. No pertinent family history.  Social History Social History   Tobacco Use  . Smoking status: Current Every Day Smoker    Packs/day: 1.00    Types: Cigarettes  . Smokeless tobacco: Never Used  Substance Use Topics  . Alcohol use: Yes    Comment: 3-4 bottles/day  . Drug use: Yes    Types: Heroin, IV    Comment: Xanax, heroin     Review of Systems  Constitutional: No fever/chills Eyes: No  visual changes. Cardiovascular: no chest pain. Respiratory: no cough. No SOB. Gastrointestinal: No abdominal pain.  No nausea, no vomiting.  Musculoskeletal: Negative for musculoskeletal pain. Skin: Positive for 421-day-old laceration to the left elbow Neurological: Negative for headaches, focal weakness or numbness. 10-point ROS otherwise negative.  ____________________________________________   PHYSICAL EXAM:  VITAL SIGNS: ED Triage Vitals [03/04/19 2031]  Enc Vitals Group     BP (!) 142/74     Pulse Rate 89     Resp 18     Temp 98.7 F (37.1 C)     Temp Source Oral     SpO2 99 %     Weight      Height      Head Circumference      Peak Flow      Pain Score      Pain Loc      Pain Edu?      Excl. in GC?      Constitutional: Alert and oriented. Well appearing and in no acute distress. Eyes: Conjunctivae are normal. PERRL. EOMI. Head: Atraumatic. Neck: No stridor.    Cardiovascular: Normal rate, regular rhythm. Normal S1 and S2.  Good peripheral circulation. Respiratory: Normal respiratory effort without tachypnea or retractions. Lungs CTAB. Good air entry to the bases with no decreased or absent breath sounds. Musculoskeletal: Full range of motion to all extremities. No gross deformities appreciated. Neurologic:  Normal speech and language. No gross focal neurologic deficits are appreciated.  Skin:  Skin is warm, dry and intact. No rash noted.  Visualization of the left elbow reveals avulsion laceration to the lateral aspect of the elbow.  This involves the subcutaneous tissue.  No exposure of muscle or tendon.  Patient is full range of motion to the joint.  No tenderness to palpation of the joint space.  No bleeding.  No foreign body.  Edges show signs of granulation tissue consistent with 1 day old laceration. Psychiatric: Mood and affect are normal. Speech and behavior are normal. Patient exhibits appropriate insight and  judgement.   ____________________________________________   LABS (all labs ordered are listed, but only abnormal results are displayed)  Labs Reviewed - No data to display ____________________________________________  EKG   ____________________________________________  RADIOLOGY   No results found.  ____________________________________________    PROCEDURES  Procedure(s) performed:    Procedures    Medications - No data to display   ____________________________________________   INITIAL IMPRESSION / ASSESSMENT AND PLAN / ED COURSE  Pertinent labs & imaging results that were available during my care of the patient were reviewed by me and considered in my medical decision making (see chart for details).  Review of the [place] CSRS was performed in accordance of the [facility] prior to dispensing any controlled drugs.           Patient's diagnosis is consistent with laceration of the left elbow.  Patient sustained a deep avulsion type laceration to the left elbow that occurred yesterday.  Edges are showing signs of granulation consistent with-day-old injury.  No surrounding erythema or edema concerning for infection.  Given location over the joint, history of bacteremia from MRSA, patient will be covered with antibiotics prophylactically.  Patient is given prescription for Bactrim for same.  I discussed at length wound care instructions.  Return precautions are discussed at length with the patient as well.  Follow-up primary care.. Patient is given ED precautions to return to the ED for any worsening or new symptoms.     ____________________________________________  FINAL CLINICAL IMPRESSION(S) / ED DIAGNOSES  Final diagnoses:  Laceration of left elbow, initial encounter      NEW MEDICATIONS STARTED DURING THIS VISIT:  ED Discharge Orders         Ordered    sulfamethoxazole-trimethoprim (BACTRIM DS) 800-160 MG tablet  2 times daily     03/04/19 2249               This chart was dictated using voice recognition software/Dragon. Despite best efforts to proofread, errors can occur which can change the meaning. Any change was purely unintentional.    Darletta Moll, PA-C 03/04/19 2257    Nena Polio, MD 03/05/19 [phone]

## 2019-03-04 NOTE — ED Notes (Signed)
Ambulatory gait, NAD to room

## 2019-03-04 NOTE — ED Triage Notes (Signed)
Pt reports he hit left elbow on glass, leaving the skin flapped open on area. Pt dressed area with dry dressing. Today, Pt to ED due to swelling. In triage, bandage is tight, swelling noted. When taking dressing off, bandage is stiff, dry and dirty. Bandage stuck to skin, removed with saline solution. New wet dressing applied to area.

## 2019-03-27 ENCOUNTER — Other Ambulatory Visit: Payer: Self-pay

## 2019-03-27 ENCOUNTER — Emergency Department (HOSPITAL_COMMUNITY): Payer: Self-pay

## 2019-03-27 ENCOUNTER — Encounter (HOSPITAL_COMMUNITY): Payer: Self-pay | Admitting: *Deleted

## 2019-03-27 ENCOUNTER — Emergency Department (HOSPITAL_COMMUNITY)
Admission: EM | Admit: 2019-03-27 | Discharge: 2019-03-27 | Disposition: A | Discharge status: Home / Self Care | Payer: Self-pay | Attending: Emergency Medicine | Admitting: Emergency Medicine

## 2019-03-27 DIAGNOSIS — S92341A Displaced fracture of fourth metatarsal bone, right foot, initial encounter for closed fracture: Secondary | ICD-10-CM | POA: Insufficient documentation

## 2019-03-27 DIAGNOSIS — T148XXA Other injury of unspecified body region, initial encounter: Secondary | ICD-10-CM

## 2019-03-27 DIAGNOSIS — S1181XA Laceration without foreign body of other specified part of neck, initial encounter: Secondary | ICD-10-CM | POA: Insufficient documentation

## 2019-03-27 DIAGNOSIS — S01111A Laceration without foreign body of right eyelid and periocular area, initial encounter: Secondary | ICD-10-CM | POA: Insufficient documentation

## 2019-03-27 DIAGNOSIS — Y939 Activity, unspecified: Secondary | ICD-10-CM | POA: Insufficient documentation

## 2019-03-27 DIAGNOSIS — F17210 Nicotine dependence, cigarettes, uncomplicated: Secondary | ICD-10-CM | POA: Insufficient documentation

## 2019-03-27 DIAGNOSIS — S81811A Laceration without foreign body, right lower leg, initial encounter: Secondary | ICD-10-CM | POA: Insufficient documentation

## 2019-03-27 DIAGNOSIS — Y999 Unspecified external cause status: Secondary | ICD-10-CM | POA: Insufficient documentation

## 2019-03-27 DIAGNOSIS — R52 Pain, unspecified: Secondary | ICD-10-CM

## 2019-03-27 DIAGNOSIS — Y929 Unspecified place or not applicable: Secondary | ICD-10-CM | POA: Insufficient documentation

## 2019-03-27 HISTORY — DX: Unspecified viral hepatitis C without hepatic coma: B19.20

## 2019-03-27 HISTORY — DX: Other psychoactive substance use, unspecified, uncomplicated: F19.90

## 2019-03-27 LAB — BPAM FFP
Blood Product Expiration Date: 202009212359
Blood Product Expiration Date: 202009222359
ISSUE DATE / TIME: 202009022000
ISSUE DATE / TIME: 202009022000
Unit Type and Rh: 6200
Unit Type and Rh: 6200

## 2019-03-27 LAB — CBC
HCT: 40.5 % (ref 39.0–52.0)
Hemoglobin: 13.6 g/dL (ref 13.0–17.0)
MCH: 30.3 pg (ref 26.0–34.0)
MCHC: 33.6 g/dL (ref 30.0–36.0)
MCV: 90.2 fL (ref 80.0–100.0)
Platelets: 223 10*3/uL (ref 150–400)
RBC: 4.49 MIL/uL (ref 4.22–5.81)
RDW: 12.1 % (ref 11.5–15.5)
WBC: 8 10*3/uL (ref 4.0–10.5)
nRBC: 0 % (ref 0.0–0.2)

## 2019-03-27 LAB — COMPREHENSIVE METABOLIC PANEL
ALT: 55 U/L — ABNORMAL HIGH (ref 0–44)
AST: 43 U/L — ABNORMAL HIGH (ref 15–41)
Albumin: 3.8 g/dL (ref 3.5–5.0)
Alkaline Phosphatase: 89 U/L (ref 38–126)
Anion gap: 12 (ref 5–15)
BUN: 17 mg/dL (ref 6–20)
CO2: 23 mmol/L (ref 22–32)
Calcium: 8.9 mg/dL (ref 8.9–10.3)
Chloride: 101 mmol/L (ref 98–111)
Creatinine, Ser: 1.12 mg/dL (ref 0.61–1.24)
GFR calc Af Amer: >60 mL/min (ref >60)
GFR calc non Af Amer: >60 mL/min (ref >60)
Glucose, Bld: 92 mg/dL (ref 70–99)
Potassium: 3.8 mmol/L (ref 3.5–5.1)
Sodium: 136 mmol/L (ref 135–145)
Total Bilirubin: 1 mg/dL (ref 0.3–1.2)
Total Protein: 7.5 g/dL (ref 6.5–8.1)

## 2019-03-27 LAB — I-STAT CHEM 8, ED
BUN: 17 mg/dL (ref 6–20)
Calcium, Ion: 1.12 mmol/L — ABNORMAL LOW (ref 1.15–1.40)
Chloride: 102 mmol/L (ref 98–111)
Creatinine, Ser: 1 mg/dL (ref 0.61–1.24)
Glucose, Bld: 89 mg/dL (ref 70–99)
HCT: 41 % (ref 39.0–52.0)
Hemoglobin: 13.9 g/dL (ref 13.0–17.0)
Potassium: 3.8 mmol/L (ref 3.5–5.1)
Sodium: 138 mmol/L (ref 135–145)
TCO2: 25 mmol/L (ref 22–32)

## 2019-03-27 LAB — TYPE AND SCREEN
ABO/RH(D): O POS
Antibody Screen: NEGATIVE
Unit division: 0
Unit division: 0

## 2019-03-27 LAB — PREPARE FRESH FROZEN PLASMA
Unit division: 0
Unit division: 0

## 2019-03-27 LAB — ABO/RH: ABO/RH(D): O POS

## 2019-03-27 LAB — BPAM RBC
Blood Product Expiration Date: 202009232359
Blood Product Expiration Date: 202009232359
ISSUE DATE / TIME: 202009022342
ISSUE DATE / TIME: 202009022342
Unit Type and Rh: 5100
Unit Type and Rh: 5100

## 2019-03-27 LAB — PROTIME-INR
INR: 1 (ref 0.8–1.2)
Prothrombin Time: 13.4 seconds (ref 11.4–15.2)

## 2019-03-27 LAB — CDS SEROLOGY

## 2019-03-27 LAB — ETHANOL: Alcohol, Ethyl (B): <10 mg/dL (ref <10)

## 2019-03-27 LAB — LACTIC ACID, PLASMA: Lactic Acid, Venous: 2.2 mmol/L (ref 0.5–1.9)

## 2019-03-27 MED ORDER — IOHEXOL 350 MG/ML SOLN
100.0000 mL | Freq: Once | INTRAVENOUS | Status: AC | PRN
  Administered 2019-03-27: 100 mL via INTRAVENOUS
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
## 2019-03-27 NOTE — Progress Notes (Signed)
Orthopedic Tech Progress Note Patient Details:  Pedro Peterson 17-Aug-1985 [id number]  Ortho Devices Type of Ortho Device: Postop shoe/boot, Crutches Ortho Device/Splint Interventions: Adjustment, Application   Post Interventions Patient Tolerated: Well   Melony Overly T 03/27/2019, 9:36 PM
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
## 2019-03-27 NOTE — Discharge Instructions (Addendum)
Take 4 over the counter ibuprofen tablets 3 times a day or 2 over-the-counter naproxen tablets twice a day for pain. Also take tylenol 1000mg (2 extra strength) four times a day.   Return for redness, drainage, fever. Keep the areas clean and moist.

## 2019-03-28 ENCOUNTER — Encounter: Payer: Self-pay | Admitting: Emergency Medicine
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
